# Patient Record
Sex: Female | Born: 1985 | Race: Black or African American | Hispanic: No | Marital: Married | Smoking: Never smoker
Health system: Southern US, Community
[De-identification: ages and names within clinical notes are randomized; demographics above are authoritative.]

## PROBLEM LIST (undated history)

## (undated) ENCOUNTER — Inpatient Hospital Stay (HOSPITAL_COMMUNITY): Payer: Self-pay

## (undated) DIAGNOSIS — D573 Sickle-cell trait: Secondary | ICD-10-CM

## (undated) DIAGNOSIS — O009 Unspecified ectopic pregnancy without intrauterine pregnancy: Secondary | ICD-10-CM

## (undated) DIAGNOSIS — E162 Hypoglycemia, unspecified: Secondary | ICD-10-CM

## (undated) DIAGNOSIS — D649 Anemia, unspecified: Secondary | ICD-10-CM

## (undated) DIAGNOSIS — O24419 Gestational diabetes mellitus in pregnancy, unspecified control: Secondary | ICD-10-CM

## (undated) HISTORY — PX: HERNIA REPAIR: SHX51

## (undated) HISTORY — PX: DILATION AND CURETTAGE OF UTERUS: SHX78

## (undated) HISTORY — PX: UMBILICAL HERNIA REPAIR: SHX2598

## (undated) HISTORY — DX: Sickle-cell trait: D57.3

---

## 2007-10-06 ENCOUNTER — Emergency Department (HOSPITAL_COMMUNITY): Admission: EM | Admit: 2007-10-06 | Discharge: 2007-10-06 | Payer: Self-pay | Admitting: Emergency Medicine

## 2010-03-05 ENCOUNTER — Emergency Department (HOSPITAL_COMMUNITY): Admission: EM | Admit: 2010-03-05 | Discharge: 2010-03-05 | Payer: Self-pay | Admitting: Emergency Medicine

## 2010-03-23 ENCOUNTER — Emergency Department (HOSPITAL_COMMUNITY): Admission: EM | Admit: 2010-03-23 | Discharge: 2010-03-23 | Payer: Self-pay | Admitting: Family Medicine

## 2010-10-28 ENCOUNTER — Inpatient Hospital Stay (INDEPENDENT_AMBULATORY_CARE_PROVIDER_SITE_OTHER)
Admission: RE | Admit: 2010-10-28 | Discharge: 2010-10-28 | Disposition: A | Discharge status: Home / Self Care | Payer: Medicaid Other | Source: Ambulatory Visit | Attending: Family Medicine | Admitting: Family Medicine

## 2010-10-28 DIAGNOSIS — R42 Dizziness and giddiness: Secondary | ICD-10-CM

## 2010-10-28 LAB — POCT I-STAT, CHEM 8
BUN: 8 mg/dL (ref 6–23)
Calcium, Ion: 1.23 mmol/L (ref 1.12–1.32)
Chloride: 102 mEq/L (ref 96–112)
HCT: 38 % (ref 36.0–46.0)
Potassium: 4.2 mEq/L (ref 3.5–5.1)

## 2010-10-28 LAB — POCT URINALYSIS DIPSTICK
Bilirubin Urine: NEGATIVE
Ketones, ur: NEGATIVE mg/dL
Protein, ur: NEGATIVE mg/dL
Specific Gravity, Urine: 1.02 (ref 1.005–1.030)

## 2010-10-28 LAB — POCT PREGNANCY, URINE: Preg Test, Ur: NEGATIVE

## 2010-11-13 LAB — URINALYSIS, ROUTINE W REFLEX MICROSCOPIC
Bilirubin Urine: NEGATIVE
Glucose, UA: NEGATIVE mg/dL
Hgb urine dipstick: NEGATIVE
Ketones, ur: NEGATIVE mg/dL
Protein, ur: NEGATIVE mg/dL
Urobilinogen, UA: 1 mg/dL (ref 0.0–1.0)

## 2010-11-13 LAB — WET PREP, GENITAL
Trich, Wet Prep: NONE SEEN
Yeast Wet Prep HPF POC: NONE SEEN

## 2010-11-13 LAB — GC/CHLAMYDIA PROBE AMP, GENITAL
Chlamydia, DNA Probe: NEGATIVE
GC Probe Amp, Genital: NEGATIVE

## 2010-11-13 LAB — URINE MICROSCOPIC-ADD ON

## 2010-12-06 ENCOUNTER — Emergency Department (HOSPITAL_COMMUNITY)
Admission: EM | Admit: 2010-12-06 | Discharge: 2010-12-07 | Disposition: A | Discharge status: Home / Self Care | Payer: Medicaid Other | Attending: Emergency Medicine | Admitting: Emergency Medicine

## 2010-12-06 DIAGNOSIS — M542 Cervicalgia: Secondary | ICD-10-CM | POA: Insufficient documentation

## 2010-12-06 DIAGNOSIS — S139XXA Sprain of joints and ligaments of unspecified parts of neck, initial encounter: Secondary | ICD-10-CM | POA: Insufficient documentation

## 2010-12-07 ENCOUNTER — Emergency Department (HOSPITAL_COMMUNITY): Payer: Medicaid Other

## 2011-01-19 ENCOUNTER — Inpatient Hospital Stay (INDEPENDENT_AMBULATORY_CARE_PROVIDER_SITE_OTHER)
Admission: RE | Admit: 2011-01-19 | Discharge: 2011-01-19 | Disposition: A | Discharge status: Home / Self Care | Payer: No Typology Code available for payment source | Source: Ambulatory Visit | Attending: Emergency Medicine | Admitting: Emergency Medicine

## 2011-01-19 DIAGNOSIS — J069 Acute upper respiratory infection, unspecified: Secondary | ICD-10-CM

## 2011-01-19 DIAGNOSIS — R197 Diarrhea, unspecified: Secondary | ICD-10-CM

## 2011-07-10 ENCOUNTER — Encounter: Payer: Self-pay | Admitting: Emergency Medicine

## 2011-07-10 ENCOUNTER — Emergency Department (HOSPITAL_COMMUNITY)
Admission: EM | Admit: 2011-07-10 | Discharge: 2011-07-10 | Disposition: A | Discharge status: Home / Self Care | Payer: Medicaid Other | Source: Home / Self Care | Attending: Family Medicine | Admitting: Family Medicine

## 2011-07-10 DIAGNOSIS — N760 Acute vaginitis: Secondary | ICD-10-CM

## 2011-07-10 LAB — POCT URINALYSIS DIP (DEVICE)
Bilirubin Urine: NEGATIVE
Glucose, UA: NEGATIVE mg/dL
Ketones, ur: NEGATIVE mg/dL
Nitrite: NEGATIVE
Specific Gravity, Urine: 1.02 (ref 1.005–1.030)
pH: 7 (ref 5.0–8.0)

## 2011-07-10 MED ORDER — METRONIDAZOLE 500 MG PO TABS: 500.0000 mg | ORAL_TABLET | Freq: Two times a day (BID) | ORAL | Status: AC

## 2011-07-10 NOTE — ED Notes (Signed)
Pt here with vag irritation and vag spotting that started on Friday.sx min odor but denies itching.pt due for menstrual nxt week.denies abd pain,fever,vomitting.lmp 06/17/11

## 2011-07-10 NOTE — ED Provider Notes (Signed)
History     CSN: [id number] Arrival date & time: 07/10/2011  2:54 PM   First MD Initiated Contact with Patient 07/10/11 1453      Chief Complaint  Patient presents with  . Vaginal Itching    (Consider location/radiation/quality/duration/timing/severity/associated sxs/prior treatment) Patient is a 25 y.o. female presenting with vaginal bleeding. The history is provided by the patient.  Vaginal Bleeding This is a new problem. The current episode started yesterday. The problem occurs constantly. The symptoms are relieved by nothing. She has tried nothing for the symptoms.  The patient reports she has intercourse Friday. Used condoms for protection. Noted some vaginal spotting sat. Period due next week. It had been since June since last intercourse. Denies pelvic pain. Has noted some odor. No uti symtpoms. No n/v//c/d.  History reviewed. No pertinent past medical history.  Past Surgical History  Procedure Date  . Hernia repair   . Cesarean section     No family history on file.  History  Substance Use Topics  . Smoking status: Never Smoker   . Smokeless tobacco: Not on file  . Alcohol Use: No    OB History    Grav Para Term Preterm Abortions TAB SAB Ect Mult Living                  Review of Systems  Constitutional: Negative.   Cardiovascular: Negative.   Gastrointestinal: Negative.   Genitourinary: Positive for vaginal bleeding.  Musculoskeletal: Negative.     Allergies  Review of patient's allergies indicates no known allergies.  Home Medications   Current Outpatient Rx  Name Route Sig Dispense Refill  . METRONIDAZOLE 500 MG PO TABS Oral Take 1 tablet (500 mg total) by mouth 2 (two) times daily. 14 tablet 0    BP 107/76  Pulse 83  Temp(Src) 99 F (37.2 C) (Oral)  Resp 16  SpO2 100%  LMP 06/17/2011  Physical Exam  Constitutional: She appears well-developed and well-nourished. No distress.  Cardiovascular: Normal rate and regular rhythm.     Pulmonary/Chest: Breath sounds normal.  Abdominal: Soft. Bowel sounds are normal. There is no tenderness.  Genitourinary:       Pelvic exam with female nursing personal Blase Mess assisting reveals no skin or vulvar lesions. No discharge. Old blood noted in vault . No cmt. Sample collected and sent. Odor noted.     ED Course  Procedures (including critical care time)  Labs Reviewed  POCT URINALYSIS DIP (DEVICE) - Abnormal; Notable for the following:    Hgb urine dipstick LARGE (*)    All other components within normal limits  POCT PREGNANCY, URINE  POCT PREGNANCY, URINE  POCT URINALYSIS DIPSTICK   No results found.   1. Vaginitis       MDM          Randa Spike, MD 07/10/11 [phone]

## 2011-07-11 LAB — GC/CHLAMYDIA PROBE AMP, GENITAL
Chlamydia, DNA Probe: NEGATIVE
GC Probe Amp, Genital: NEGATIVE

## 2011-07-28 ENCOUNTER — Emergency Department (HOSPITAL_COMMUNITY)
Admission: EM | Admit: 2011-07-28 | Discharge: 2011-07-28 | Disposition: A | Discharge status: Home / Self Care | Payer: Self-pay | Source: Home / Self Care | Attending: Emergency Medicine | Admitting: Emergency Medicine

## 2011-07-28 NOTE — ED Provider Notes (Signed)
History     CSN: [id number] Arrival date & time: No admission date for patient encounter.   First MD Initiated Contact with Patient 07/28/11 1709      No chief complaint on file.   (Consider location/radiation/quality/duration/timing/severity/associated sxs/prior treatment) HPI  No past medical history on file.  Past Surgical History  Procedure Date  . Hernia repair   . Cesarean section     No family history on file.  History  Substance Use Topics  . Smoking status: Never Smoker   . Smokeless tobacco: Not on file  . Alcohol Use: No    OB History    Grav Para Term Preterm Abortions TAB SAB Ect Mult Living                  Review of Systems  Allergies  Review of patient's allergies indicates no known allergies.  Home Medications  No current outpatient prescriptions on file.  LMP 06/17/2011  Physical Exam  ED Course  Procedures (including critical care time)  Labs Reviewed - No data to display No results found.   No diagnosis found.    MDM     I did not participate in the care of this patient. AMortensonMD    Luiz Blare, MD 07/28/11 2231

## 2011-07-30 ENCOUNTER — Encounter (HOSPITAL_COMMUNITY): Payer: Self-pay | Admitting: *Deleted

## 2011-07-30 ENCOUNTER — Emergency Department (HOSPITAL_COMMUNITY)
Admission: EM | Admit: 2011-07-30 | Discharge: 2011-07-30 | Disposition: A | Discharge status: Home / Self Care | Payer: Medicaid Other | Source: Home / Self Care

## 2011-07-30 DIAGNOSIS — J069 Acute upper respiratory infection, unspecified: Secondary | ICD-10-CM

## 2011-07-30 MED ORDER — BENZONATATE 100 MG PO CAPS: ORAL_CAPSULE | ORAL | Status: AC

## 2011-07-30 MED ORDER — IBUPROFEN 800 MG PO TABS: 800.0000 mg | ORAL_TABLET | Freq: Three times a day (TID) | ORAL | Status: AC

## 2011-07-30 NOTE — ED Notes (Signed)
C/O sore throat x 1 wk; started few days later w/ productive cough and nasal congestion w/ HAs.  Has taken Advil and decongestant.  Denies fevers.

## 2011-07-30 NOTE — ED Provider Notes (Signed)
History     CSN: [id number] Arrival date & time: 07/30/2011 12:03 PM   None     Chief Complaint  Patient presents with  . Sore Throat  . Cough  . Nasal Congestion    (Consider location/radiation/quality/duration/timing/severity/associated sxs/prior treatment) HPI Comments: Onset of sore throat approx one week ago. Worse at night. No change with swallowing. Then 2 days ago began with nasal congestion and cough. No fever or myalgias. Is taking Advil and Nyquil with minimal relief.    Patient is a 25 y.o. female presenting with pharyngitis and cough. The history is provided by the patient.  Sore Throat This is a new problem. Episode onset: one week ago. The problem occurs constantly. The problem has not changed since onset.Pertinent negatives include no shortness of breath. The symptoms are aggravated by nothing. Treatments tried: advil. The treatment provided mild relief.  Cough This is a new problem. The current episode started more than 2 days ago. The problem occurs hourly. The problem has not changed since onset.The cough is productive of sputum. There has been no fever. Associated symptoms include chills, rhinorrhea and sore throat. Pertinent negatives include no ear pain, no myalgias, no shortness of breath and no wheezing. She has tried cough syrup for the symptoms. The treatment provided no relief. She is not a smoker. Her past medical history does not include asthma.    History reviewed. No pertinent past medical history.  Past Surgical History  Procedure Date  . Hernia repair   . Cesarean section     History reviewed. No pertinent family history.  History  Substance Use Topics  . Smoking status: Former Games developer  . Smokeless tobacco: Not on file  . Alcohol Use: No    OB History    Grav Para Term Preterm Abortions TAB SAB Ect Mult Living                  Review of Systems  Constitutional: Positive for chills. Negative for fever.  HENT: Positive for congestion,  sore throat, rhinorrhea and postnasal drip. Negative for ear pain and sinus pressure.   Respiratory: Positive for cough. Negative for shortness of breath and wheezing.   Gastrointestinal: Negative for nausea and vomiting.  Musculoskeletal: Negative for myalgias.    Allergies  Review of patient's allergies indicates no known allergies.  Home Medications  No current outpatient prescriptions on file.  BP 115/73  Pulse 87  Temp(Src) 99.5 F (37.5 C) (Oral)  Resp 16  SpO2 100%  LMP 07/10/2011  Physical Exam  Nursing note and vitals reviewed. Constitutional: She appears well-developed and well-nourished. No distress.  HENT:  Head: Normocephalic and atraumatic.  Right Ear: Tympanic membrane, external ear and ear canal normal.  Left Ear: Tympanic membrane, external ear and ear canal normal.  Nose: Nose normal.  Mouth/Throat: Uvula is midline, oropharynx is clear and moist and mucous membranes are normal. No oropharyngeal exudate, posterior oropharyngeal edema or posterior oropharyngeal erythema.  Neck: Neck supple.  Cardiovascular: Normal rate, regular rhythm and normal heart sounds.   Pulmonary/Chest: Effort normal and breath sounds normal. No respiratory distress.  Lymphadenopathy:    She has no cervical adenopathy.  Neurological: She is alert.  Skin: Skin is warm and dry.  Psychiatric: She has a normal mood and affect.    ED Course  Procedures (including critical care time)  Labs Reviewed - No data to display No results found.   No diagnosis found.    MDM  Melody Comas, [place] 07/30/11 1318

## 2011-08-04 NOTE — ED Provider Notes (Signed)
Dx Amanda [name]  Sent home with tesslon motrin Medical screening examination/treatment/procedure(s) were performed by non-physician practitioner and as supervising physician I was immediately available for consultation/collaboration.  Luiz Blare MD   Luiz Blare, MD 08/04/11 [phone]

## 2012-10-21 ENCOUNTER — Emergency Department (INDEPENDENT_AMBULATORY_CARE_PROVIDER_SITE_OTHER)
Admission: EM | Admit: 2012-10-21 | Discharge: 2012-10-21 | Disposition: A | Discharge status: Home / Self Care | Payer: Self-pay | Source: Home / Self Care | Attending: Emergency Medicine | Admitting: Emergency Medicine

## 2012-10-21 ENCOUNTER — Emergency Department (INDEPENDENT_AMBULATORY_CARE_PROVIDER_SITE_OTHER): Payer: Self-pay

## 2012-10-21 ENCOUNTER — Other Ambulatory Visit: Payer: Self-pay

## 2012-10-21 ENCOUNTER — Encounter (HOSPITAL_COMMUNITY): Payer: Self-pay | Admitting: Emergency Medicine

## 2012-10-21 DIAGNOSIS — R0789 Other chest pain: Secondary | ICD-10-CM

## 2012-10-21 DIAGNOSIS — K219 Gastro-esophageal reflux disease without esophagitis: Secondary | ICD-10-CM

## 2012-10-21 MED ORDER — MELOXICAM 15 MG PO TABS: 15.0000 mg | ORAL_TABLET | Freq: Every day | ORAL | Status: DC

## 2012-10-21 MED ORDER — CYCLOBENZAPRINE HCL 5 MG PO TABS: 5.0000 mg | ORAL_TABLET | Freq: Three times a day (TID) | ORAL | Status: DC | PRN

## 2012-10-21 MED ORDER — OMEPRAZOLE 20 MG PO CPDR: 20.0000 mg | DELAYED_RELEASE_CAPSULE | Freq: Two times a day (BID) | ORAL | Status: DC

## 2012-10-21 NOTE — ED Provider Notes (Signed)
Chief Complaint  Patient presents with  . Chest Pain    History of Present Illness:   Amanda [name] is a 27 year old female who presents with a one-year history of recurring chest pain. The pain is located in the left pectoral area with occasional radiation down the left arm and some numbness and tingling in the arm. The pain comes and goes, is described as a shooting pain and is rated 2-3/10 in intensity. Over the past 3 weeks she's had daily pain that can last for minutes and on one occasion the pain in the arm lasted for an hour. Nothing makes it worse including exercise, exertion, position, activity, or meals. The pain is not pleuritic and not worse with coughing, sneezing, or laughing. Nothing makes the pain better. It's not associated with shortness of breath, nausea, or diaphoresis. She denies any fever, chills, or weight loss she also has intermittent blurry vision and headache. She's had no coughing, wheezing, or shortness of breath. She's had some indigestion since December. She denies any palpitations, dizziness, or syncope. She's had no abdominal pain, nausea, or vomiting. She denies any cardiac history, or history of diabetes, hypertension, hypercholesterolemia, cigarette smoking, or family history of heart disease. She denies any leg pain or swelling.  Review of Systems:  Other than noted above, the patient denies any of the following symptoms. Systemic:  No fever, chills, sweats, or fatigue. ENT:  No nasal congestion, rhinorrhea, or sore throat. Pulmonary:  No cough, wheezing, shortness of breath, sputum production, hemoptysis. Cardiac:  No palpitations, rapid heartbeat, dizziness, presyncope or syncope. GI:  No abdominal pain, heartburn, nausea, or vomiting. Ext:  No leg pain or swelling.  PMFSH:  Past medical history, family history, social history, meds, and allergies were reviewed and updated as needed.   Physical Exam:   Vital signs:  BP 121/86  Pulse 91  Temp(Src) 98.8 F  (37.1 C) (Oral)  Resp 19  SpO2 99%  LMP 09/21/2012 Gen:  Alert, oriented, in no distress, skin warm and dry. Eye:  PERRL, lids and conjunctivas normal.  Sclera non-icteric. ENT:  Mucous membranes moist, pharynx clear. Neck:  Supple, no adenopathy or tenderness.  No JVD. Lungs:  Clear to auscultation, no wheezes, rales or rhonchi.  No respiratory distress. Heart:  Regular rhythm.  No gallops, murmers, clicks or rubs. Chest:  No chest wall tenderness. Abdomen:  Soft, nontender, no organomegaly or mass.  Bowel sounds normal.  No pulsatile abdominal mass or bruit. Ext:  No edema.  No calf tenderness and Homann's sign negative.  Pulses full and equal. Skin:  Warm and dry.  No rash.   Radiology:  Dg Chest 2 View  10/21/2012  *RADIOLOGY REPORT*  Clinical Data: Chest pain  CHEST - 2 VIEW  Comparison: None.  Findings: There is no pulmonary edema, focal infiltrate, or pleural effusion.  The mediastinal contour and cardiac silhouette are normal.  The soft tissues and osseous structures are normal.  IMPRESSION: No acute cardiopulmonary disease identified.   Original Report Authenticated By: Sherian Rein, M.D.    I reviewed the images independently and personally and concur with the radiologist's findings.  EKG:   Date: 10/21/2012  Rate: 77  Rhythm: normal sinus rhythm  QRS Axis: right R vector was 109  Intervals: normal  ST/T Wave abnormalities: normal  Conduction Disutrbances:none  Narrative Interpretation: Normal sinus rhythm, rightward axis, otherwise normal EKG.  Old EKG Reviewed: none available  Assessment:  The primary encounter diagnosis was Musculoskeletal chest pain. A diagnosis of  GERD (gastroesophageal reflux disease) was also pertinent to this visit.   Plan:   1.  The following meds were prescribed:   Discharge Medication List as of 10/21/2012  9:01 PM    START taking these medications   Details  cyclobenzaprine (FLEXERIL) 5 MG tablet Take 1 tablet (5 mg total) by mouth 3  (three) times daily as needed for muscle spasms., Starting 10/21/2012, Until Discontinued, Normal    meloxicam (MOBIC) 15 MG tablet Take 1 tablet (15 mg total) by mouth daily., Starting 10/21/2012, Until Discontinued, Normal    omeprazole (PRILOSEC) 20 MG capsule Take 1 capsule (20 mg total) by mouth 2 (two) times daily before a meal., Starting 10/21/2012, Until Discontinued, Normal       2.  The patient was instructed in symptomatic care and handouts were given. She was given dietary instructions for reflux symptoms. 3.  The patient was told to return if becoming worse in any way, if no better in 3 or 4 days, and given some red flag symptoms such as shortness of breath, fever, dizziness, syncope, diaphoresis, worsening pain, or nausea and vomiting that would indicate earlier return.  Follow up:  The patient was told to follow up with primary care physician in one to 2 weeks.    Reuben Likes, MD 10/21/12 2114

## 2012-10-21 NOTE — ED Notes (Signed)
[facility] has been called. 

## 2012-10-21 NOTE — ED Notes (Signed)
Pt is here for chest pains x1 year Came today b/c her sx have been more frequent w/in the past month Sx include: intermittent numbness of left arm that'll last 1.5 hours and intermittent chest pain on left side that'll last 5 minutes Also c/o blurry vision, headache Denies: SOB, edema, inj/trauma, strenuous activity Reports being sick w/a cold in the beg of Jan; lots of coughing Last took Motrin on Sat for the discomfort  She is alert and responsive w/no signs of acute distress.

## 2013-06-05 ENCOUNTER — Encounter (HOSPITAL_COMMUNITY): Payer: Self-pay | Admitting: Emergency Medicine

## 2013-06-05 ENCOUNTER — Emergency Department (HOSPITAL_COMMUNITY)
Admission: EM | Admit: 2013-06-05 | Discharge: 2013-06-05 | Disposition: A | Discharge status: Home / Self Care | Payer: BC Managed Care – PPO | Source: Home / Self Care | Attending: Emergency Medicine | Admitting: Emergency Medicine

## 2013-06-05 DIAGNOSIS — G43909 Migraine, unspecified, not intractable, without status migrainosus: Secondary | ICD-10-CM

## 2013-06-05 HISTORY — DX: Hypoglycemia, unspecified: E16.2

## 2013-06-05 LAB — POCT I-STAT, CHEM 8
Calcium, Ion: 1.29 mmol/L — ABNORMAL HIGH (ref 1.12–1.23)
Chloride: 102 mEq/L (ref 96–112)
HCT: 39 % (ref 36.0–46.0)
Sodium: 139 mEq/L (ref 135–145)

## 2013-06-05 MED ORDER — SUMATRIPTAN SUCCINATE 50 MG PO TABS: 50.0000 mg | ORAL_TABLET | ORAL | Status: DC | PRN

## 2013-06-05 NOTE — ED Notes (Signed)
Reports an episode this afternoon of feeling off balance, dizzy, difficulty focusing, and blurry vision.

## 2013-06-05 NOTE — ED Provider Notes (Signed)
Chief Complaint:   Chief Complaint  Patient presents with  . 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 This happened around 2:45 PM. She denies any specific precipitating factor. She noted first blurring of her vision. She noted a cloudy spot in the central visual field of her left eye. She was able to notice some clouding in the right eye as well. This lasted for about 30 minutes. Thereafter she felt dizzy and lightheaded. She denies any whirling vertigo. She describes an "out of body sensation." She then developed a throbbing frontal headache which was 7-8/10 in intensity now down to 3/10. She felt disoriented and off balance. The patient states she blacked out for about a second and fell backwards and hit the wall. She did not faint completely. She states she's had spells like this before and they've been due to hypoglycemia. She states she was here for one such episode in which her sugar was very low, however reviewing her previous visits I do not see any episodes which document a low blood sugar. She did have a similar episode several years ago but at that time her glucose was 83. She has a history of migraine type headaches in the past which are similar to these. She's never seen a physician for them and never had any testing or treatment. She's had episodes similar to this in the past when her sugar drops too low. She was unable to get her sugar tested today. She denies any paresthesias, diplopia, difficulty with speech, swallowing, chest pain, shortness of breath, palpitations, difficulty with ambulation or coordination. She denies any nausea, vomiting, photophobia, or phonophobia.  Review of Systems:  Other than noted above, the patient denies any of the following symptoms: Systemic:  No fever, chills, fatigue, photophobia, stiff neck. Eye:  No redness, eye pain, discharge,  blurred vision, or diplopia. ENT:  No nasal congestion, rhinorrhea, sinus pressure or pain, sneezing, earache, or sore throat.  No jaw claudication. Neuro:  No paresthesias, loss of consciousness, seizure activity, muscle weakness, trouble with coordination or gait, trouble speaking or swallowing. Psych:  No depression, anxiety or trouble sleeping.  PMFSH:  Past medical history, family history, social history, meds, and allergies were reviewed.  Last menstrual period was September 20. She denies sexual activity.  Physical Exam:   Vital signs:  BP 115/84  Pulse 97  Temp(Src) 99.6 F (37.6 C) (Oral)  Resp 16  SpO2 100% Filed Vitals:   06/05/13 1616 06/05/13 1758 Supine  06/05/13 1800 Sitting  06/05/13 1802 Standing   BP: 109/78 118/73 112/79 115/84  Pulse: 89 87 85 97  Temp: 99.6 F (37.6 C)     TempSrc: Oral     Resp: 16     SpO2: 100%      General:  Alert and oriented.  In no distress. Eye:  Lids and conjunctivas normal.  PERRL,  Full EOMs.  Fundi benign with normal discs and vessels. ENT:  No cranial or facial tenderness to palpation.  TMs and canals clear.  Nasal mucosa was normal and uncongested without any drainage. No intra oral lesions, pharynx clear, mucous membranes moist, dentition normal. Neck:  Supple, full ROM, no tenderness to palpation.  No adenopathy or mass. Neuro:  Alert and orented times 3.  Speech was clear, fluent, and appropriate.  Cranial nerves intact. No pronator drift, muscle strength normal. Finger to nose  normal.  DTRs were 2+ and symmetrical.Station and gait were normal.  Romberg's sign was normal.  Able to perform tandem gait well. Psych:  Normal affect.  Results for orders placed during the hospital encounter of 06/05/13  POCT I-STAT, CHEM 8      Result Value Range   Sodium 139  135 - 145 mEq/L   Potassium 4.3  3.5 - 5.1 mEq/L   Chloride 102  96 - 112 mEq/L   BUN 14  6 - 23 mg/dL   Creatinine, Ser 1.61  0.50 - 1.10 mg/dL   Glucose, Bld 80  70 -  99 mg/dL   Calcium, Ion 0.96 (*) 1.12 - 1.23 mmol/L   TCO2 26  0 - 100 mmol/L   Hemoglobin 13.3  12.0 - 15.0 g/dL   HCT 04.5  40.9 - 81.1 %    Assessment:  The encounter diagnosis was Migraine headache.  There is nothing to suggest hypoglycemia. This is completely compatible with a migraine phenomenon.  Plan:   1.  Meds:  The following meds were prescribed:   Discharge Medication List as of 06/05/2013  6:11 PM    START taking these medications   Details  SUMAtriptan (IMITREX) 50 MG tablet Take 1 tablet (50 mg total) by mouth every 2 (two) hours as needed for migraine. May repeat in 2 hours if headache persists or recurs., Starting 06/05/2013, Until Discontinued, Normal        2.  Patient Education/Counseling:  The patient was given appropriate handouts, self care instructions, and instructed in symptomatic relief.  Suggested she try the sumatriptan along with 2-4 ibuprofen tablets if she should develop a headache in the future. It may be going without food or drops in her blood sugar might be triggering factors for these, but I do not think that her symptoms are entirely to just a low blood sugar.  3.  Follow up:  The patient was told to follow up if no better in 3 to 4 days, if becoming worse in any way, and given some red flag symptoms such as any new neurological or cardiovascular symptoms which would prompt immediate return.  Follow up here as necessary.     Reuben Likes, MD 06/05/13 2159

## 2013-08-26 ENCOUNTER — Emergency Department (HOSPITAL_COMMUNITY)
Admission: EM | Admit: 2013-08-26 | Discharge: 2013-08-26 | Disposition: A | Discharge status: Home / Self Care | Payer: BC Managed Care – PPO | Source: Home / Self Care

## 2013-08-26 ENCOUNTER — Encounter (HOSPITAL_COMMUNITY): Payer: Self-pay | Admitting: Emergency Medicine

## 2013-08-26 ENCOUNTER — Other Ambulatory Visit (HOSPITAL_COMMUNITY)
Admission: RE | Admit: 2013-08-26 | Discharge: 2013-08-26 | Disposition: A | Discharge status: Home / Self Care | Payer: BC Managed Care – PPO | Source: Ambulatory Visit | Attending: Emergency Medicine | Admitting: Emergency Medicine

## 2013-08-26 DIAGNOSIS — N760 Acute vaginitis: Secondary | ICD-10-CM | POA: Insufficient documentation

## 2013-08-26 DIAGNOSIS — Z113 Encounter for screening for infections with a predominantly sexual mode of transmission: Secondary | ICD-10-CM | POA: Insufficient documentation

## 2013-08-26 LAB — POCT PREGNANCY, URINE: Preg Test, Ur: NEGATIVE

## 2013-08-26 LAB — POCT URINALYSIS DIP (DEVICE)
Glucose, UA: NEGATIVE mg/dL
Nitrite: NEGATIVE
Protein, ur: 30 mg/dL — AB
Urobilinogen, UA: 0.2 mg/dL (ref 0.0–1.0)
pH: 5.5 (ref 5.0–8.0)

## 2013-08-26 MED ORDER — FLUCONAZOLE 150 MG PO TABS: 150.0000 mg | ORAL_TABLET | Freq: Once | ORAL | Status: DC

## 2013-08-26 MED ORDER — METRONIDAZOLE 500 MG PO TABS: 500.0000 mg | ORAL_TABLET | Freq: Two times a day (BID) | ORAL | Status: DC

## 2013-08-26 NOTE — ED Notes (Signed)
C/o vaginal itching

## 2013-08-26 NOTE — ED Notes (Signed)
Call back for labs verified

## 2013-08-26 NOTE — ED Provider Notes (Signed)
Chief Complaint:   Chief Complaint  Patient presents with  . Vaginal Itching    History of Present Illness:   Amanda [name] is a 27 year old female who has had a four-day history of vaginal irritation, itching, and clear discharge. She denies any odor. She's had no pelvic pain, dysuria, fever, chills, nausea, or vomiting. She has felt somewhat nauseated and her last menstrual period was December 7. It began again today. She tends to have heavy periods. She is sexually active and not using any birth control form.  Review of Systems:  Other than noted above, the patient denies any of the following symptoms: Systemic:  No fever, chills, sweats, or weight loss. GI:  No abdominal pain, nausea, anorexia, vomiting, diarrhea, constipation, melena or hematochezia. GU:  No dysuria, frequency, urgency, hematuria, vaginal discharge, itching, or abnormal vaginal bleeding. Skin:  No rash or itching.  PMFSH:  Past medical history, family history, social history, meds, and allergies were reviewed.   Physical Exam:   Vital signs:  BP 136/84  Pulse 88  Temp(Src) 98.4 F (36.9 C) (Oral)  Resp 16  SpO2 100% General:  Alert, oriented and in no distress. Lungs:  Breath sounds clear and equal bilaterally.  No wheezes, rales or rhonchi. Heart:  Regular rhythm.  No gallops or murmers. Abdomen:  Soft, flat and non-distended.  No organomegaly or mass.  No tenderness, guarding or rebound.  Bowel sounds normally active. Pelvic exam:  Normal external genitalia, there was a large amount of yellowish, nonodorous vaginal discharge. Cervix appeared normal. There was no pain on cervical motion. Uterus was normal in size and nontender. No adnexal tenderness or mass. DNA probes for gonorrhea, Chlamydia, Trichomonas, Gardnerella, Candida were obtained. Skin:  Clear, warm and dry.  Labs:   Results for orders placed during the hospital encounter of 08/26/13  POCT URINALYSIS DIP (DEVICE)      Result Value Range   Glucose,  UA NEGATIVE  NEGATIVE mg/dL   Bilirubin Urine NEGATIVE  NEGATIVE   Ketones, ur NEGATIVE  NEGATIVE mg/dL   Specific Gravity, Urine >=1.030  1.005 - 1.030   Hgb urine dipstick SMALL (*) NEGATIVE   pH 5.5  5.0 - 8.0   Protein, ur 30 (*) NEGATIVE mg/dL   Urobilinogen, UA 0.2  0.0 - 1.0 mg/dL   Nitrite NEGATIVE  NEGATIVE   Leukocytes, UA TRACE (*) NEGATIVE  POCT PREGNANCY, URINE      Result Value Range   Preg Test, Ur NEGATIVE  NEGATIVE    Assessment:  The encounter diagnosis was Vaginitis.  Plan:   1.  Meds:  The following meds were prescribed:   Discharge Medication List as of 08/26/2013  9:07 AM    START taking these medications   Details  fluconazole (DIFLUCAN) 150 MG tablet Take 1 tablet (150 mg total) by mouth once., Starting 08/26/2013, Normal    metroNIDAZOLE (FLAGYL) 500 MG tablet Take 1 tablet (500 mg total) by mouth 2 (two) times daily., Starting 08/26/2013, Until Discontinued, Normal        2.  Patient Education/Counseling:  The patient was given appropriate handouts, self care instructions, and instructed in symptomatic relief.   3.  Follow up:  The patient was told to follow up if no better in 3 to 4 days, if becoming worse in any way, and given some red flag symptoms such as pelvic pain or fever which would prompt immediate return.  Follow up here as necessary.     Reuben Likes, MD 08/26/13  1450 

## 2013-08-29 ENCOUNTER — Emergency Department (HOSPITAL_COMMUNITY)
Admission: EM | Admit: 2013-08-29 | Discharge: 2013-08-29 | Disposition: A | Discharge status: Home / Self Care | Payer: BC Managed Care – PPO | Source: Home / Self Care | Attending: Family Medicine | Admitting: Family Medicine

## 2013-08-29 ENCOUNTER — Encounter (HOSPITAL_COMMUNITY): Payer: Self-pay | Admitting: Emergency Medicine

## 2013-08-29 DIAGNOSIS — A6000 Herpesviral infection of urogenital system, unspecified: Secondary | ICD-10-CM

## 2013-08-29 MED ORDER — VALACYCLOVIR HCL 1 G PO TABS: 1000.0000 mg | ORAL_TABLET | Freq: Two times a day (BID) | ORAL | Status: DC

## 2013-08-29 NOTE — ED Provider Notes (Signed)
CSN: [id number]     Arrival date & time 08/29/13  0806 History   First MD Initiated Contact with Patient 08/29/13 [phone]     Chief Complaint  Patient presents with  . Vaginal Itching   (Consider location/radiation/quality/duration/timing/severity/associated sxs/prior Treatment) Patient is a 28 y.o. female presenting with vaginal itching. The history is provided by the patient.  Vaginal Itching This is a new problem. The current episode started 2 days ago. The problem has been gradually worsening (seen 12/30 with vag d/c--bv, now with pain blistering vag rash.). Pertinent negatives include no abdominal pain.    Past Medical History  Diagnosis Date  . Hypoglycemia    Past Surgical History  Procedure Laterality Date  . Hernia repair    . Cesarean section    . Umbilical hernia repair     History reviewed. No pertinent family history. History  Substance Use Topics  . Smoking status: Former Research scientist (life sciences)  . Smokeless tobacco: Not on file  . Alcohol Use: No   OB History   Grav Para Term Preterm Abortions TAB SAB Ect Mult Living                 Review of Systems  Constitutional: Negative.   Gastrointestinal: Negative for abdominal pain.  Genitourinary: Positive for vaginal pain and pelvic pain. Negative for vaginal bleeding and vaginal discharge.  Musculoskeletal: Negative.     Allergies  Review of patient's allergies indicates no known allergies.  Home Medications   Current Outpatient Rx  Name  Route  Sig  Dispense  Refill  . cyclobenzaprine (FLEXERIL) 5 MG tablet   Oral   Take 1 tablet (5 mg total) by mouth 3 (three) times daily as needed for muscle spasms.   30 tablet   3   . fluconazole (DIFLUCAN) 150 MG tablet   Oral   Take 1 tablet (150 mg total) by mouth once.   1 tablet   3   . meloxicam (MOBIC) 15 MG tablet   Oral   Take 1 tablet (15 mg total) by mouth daily.   30 tablet   0   . metroNIDAZOLE (FLAGYL) 500 MG tablet   Oral   Take 1 tablet (500 mg total) by  mouth 2 (two) times daily.   14 tablet   3   . omeprazole (PRILOSEC) 20 MG capsule   Oral   Take 1 capsule (20 mg total) by mouth 2 (two) times daily before a meal.   60 capsule   0   . SUMAtriptan (IMITREX) 50 MG tablet   Oral   Take 1 tablet (50 mg total) by mouth every 2 (two) hours as needed for migraine. May repeat in 2 hours if headache persists or recurs.   10 tablet   0   . valACYclovir (VALTREX) 1000 MG tablet   Oral   Take 1 tablet (1,000 mg total) by mouth 2 (two) times daily.   20 tablet   1    BP 124/74  Pulse 78  Temp(Src) 98.6 F (37 C) (Oral)  Resp 16  SpO2 100%  LMP 08/29/2013 Physical Exam  Nursing note and vitals reviewed. Constitutional: She appears well-developed and well-nourished.  Abdominal: Soft. Bowel sounds are normal.  Genitourinary: Pelvic exam was performed with patient supine. There is rash, tenderness and lesion on the left labia. No vaginal discharge found.  Skin: Skin is warm and dry.    ED Course  Procedures (including critical care time) Labs Review Labs Reviewed  HERPES SIMPLEX  VIRUS CULTURE   Imaging Review No results found.  EKG Interpretation    Date/Time:    Ventricular Rate:    PR Interval:    QRS Duration:   QT Interval:    QTC Calculation:   R Axis:     Text Interpretation:              MDM      Billy Fischer, MD 08/29/13 [phone]

## 2013-08-29 NOTE — ED Notes (Signed)
Pt  Has  Symptoms  Of  Vaginal irritation  And         Bumps     -  Pt  Was   Seen  A  Few  Days  Ago  For   A  Vaginal  Infection   -  She  States  She  Did  Not  Have  The  Bumps    Then      She  States  She  Is  On her  Cycle  At  This  Time

## 2013-08-29 NOTE — ED Notes (Signed)
GC/Chlamydia neg., Affirm: Candida and Trich neg., Gardnerella pos. Pt. adequately treated with Flagyl. Roselyn Meier 08/29/2013

## 2013-09-01 LAB — HERPES SIMPLEX VIRUS CULTURE: CULTURE: DETECTED

## 2013-09-03 ENCOUNTER — Telehealth (HOSPITAL_COMMUNITY): Payer: Self-pay | Admitting: *Deleted

## 2013-09-03 NOTE — ED Notes (Addendum)
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  Pt. adequately treated with Valtrex.  I called pt. and left a message to call.  Call 1. Amanda [name] 09/03/2013 Pt. called back.   I could not talk to her then because I was transferring a pt. but I told her I would call back later.  I called back.  Pt. verified x 2 and given results.  Pt. told she was adequately treated with Valtrex.  Pt. Instructed to notify her partner. You can pass the virus even when you don't have an outbreak, so always practice safe sex. Get treated for each outbreak with Acyclovir or Valtrex. You may want to get an OB-GYN doctor who can call in a prescription for you when you have an outbreak or for suppressive treatment. Pt. voiced understanding. Amanda [name] 09/03/2013

## 2014-01-02 ENCOUNTER — Ambulatory Visit (INDEPENDENT_AMBULATORY_CARE_PROVIDER_SITE_OTHER): Payer: BC Managed Care – PPO | Admitting: Internal Medicine

## 2014-01-02 VITALS — BP 116/70 | HR 82 | Temp 98.5°F | Resp 16 | Ht 66.0 in | Wt 158.2 lb

## 2014-01-02 DIAGNOSIS — L293 Anogenital pruritus, unspecified: Secondary | ICD-10-CM

## 2014-01-02 DIAGNOSIS — A6009 Herpesviral infection of other urogenital tract: Secondary | ICD-10-CM

## 2014-01-02 DIAGNOSIS — N760 Acute vaginitis: Secondary | ICD-10-CM

## 2014-01-02 DIAGNOSIS — N898 Other specified noninflammatory disorders of vagina: Secondary | ICD-10-CM

## 2014-01-02 DIAGNOSIS — A6000 Herpesviral infection of urogenital system, unspecified: Secondary | ICD-10-CM

## 2014-01-02 LAB — POCT WET PREP WITH KOH
KOH Prep POC: POSITIVE
Trichomonas, UA: NEGATIVE
Yeast Wet Prep HPF POC: NEGATIVE

## 2014-01-02 MED ORDER — MUPIROCIN 2 % EX OINT: 1.0000 "application " | TOPICAL_OINTMENT | Freq: Three times a day (TID) | CUTANEOUS | Status: DC

## 2014-01-02 MED ORDER — VALACYCLOVIR HCL 1 G PO TABS: 1000.0000 mg | ORAL_TABLET | Freq: Two times a day (BID) | ORAL | Status: DC

## 2014-01-02 MED ORDER — METRONIDAZOLE 500 MG PO TABS: 500.0000 mg | ORAL_TABLET | Freq: Two times a day (BID) | ORAL | Status: DC

## 2014-01-02 NOTE — Patient Instructions (Signed)
Genital Herpes  Genital herpes is a sexually transmitted disease. This means that it is a disease passed by having sex with an infected person. There is no cure for genital herpes. The time between attacks can be months to years. The virus may live in a person but produce no problems (symptoms). This infection can be passed to a baby as it travels down the birth canal (vagina). In a newborn, this can cause central nervous system damage, eye damage, or even death. The virus that causes genital herpes is usually HSV-2 virus. The virus that causes oral herpes is usually HSV-1. The diagnosis (learning what is wrong) is made through culture results.  SYMPTOMS   Usually symptoms of pain and itching begin a few days to a week after contact. It first appears as small blisters that progress to small painful ulcers which then scab over and heal after several days. It affects the outer genitalia, birth canal, cervix, penis, anal area, buttocks, and thighs.  HOME CARE INSTRUCTIONS   · Keep ulcerated areas dry and clean.  · Take medications as directed. Antiviral medications can speed up healing. They will not prevent recurrences or cure this infection. These medications can also be taken for suppression if there are frequent recurrences.  · While the infection is active, it is contagious. Avoid all sexual contact during active infections.  · Condoms may help prevent spread of the herpes virus.  · Practice safe sex.  · Wash your hands thoroughly after touching the genital area.  · Avoid touching your eyes after touching your genital area.  · Inform your caregiver if you have had genital herpes and become pregnant. It is your responsibility to insure a safe outcome for your baby in this pregnancy.  · Only take over-the-counter or prescription medicines for pain, discomfort, or fever as directed by your caregiver.  SEEK MEDICAL CARE IF:   · You have a recurrence of this infection.  · You do not respond to medications and are not  improving.  · You have new sources of pain or discharge which have changed from the original infection.  · You have an oral temperature above 102° F (38.9° C).  · You develop abdominal pain.  · You develop eye pain or signs of eye infection.  Document Released: 08/11/2000 Document Revised: 11/06/2011 Document Reviewed: 09/01/2009  Exit[employer]® Patient Information ©2014 [employer].

## 2014-01-02 NOTE — Progress Notes (Signed)
   Subjective:    Patient ID: Amanda [name], female    DOB: 1985/12/11, 28 y.o.   MRN: [id number]  HPI 28 year old female pt here for vaginal itching. The itching has been going on for about three days. No discharge. No new recent partner. No change of soap. No OTC medications used for relief. Noticed a bump on the outside of her vagina yesterday. The bump is irritating. Pt thinks she has bacterial infection. Currently out of valtrex. Uses when she has a flare up.    Review of Systems     Objective:   Physical Exam  Constitutional: She is oriented to person, place, and time. She appears well-developed and well-nourished.  HENT:  Head: Normocephalic.  Eyes: EOM are normal.  Neck: Normal range of motion.  Pulmonary/Chest: Effort normal.  Genitourinary: Vagina normal.    Pelvic exam was performed with patient in the knee-chest position. There is no rash, tenderness, lesion or injury on the right labia. There is rash, tenderness and lesion on the left labia. No vaginal discharge found.  Neurological: She is alert and oriented to person, place, and time. She exhibits normal muscle tone. Coordination normal.  Skin: Rash noted.  Psychiatric: She has a normal mood and affect.   3-4 vesicles with purulence Hx proven hsv2 Bacterial culture done Results for orders placed in visit on 01/02/14  POCT WET PREP WITH KOH      Result Value Ref Range   Trichomonas, UA Negative     Clue Cells Wet Prep HPF POC 100%     Epithelial Wet Prep HPF POC 10-tntc     Yeast Wet Prep HPF POC neg     Bacteria Wet Prep HPF POC 4+     RBC Wet Prep HPF POC 0-1     WBC Wet Prep HPF POC 5-10     KOH Prep POC Positive           Assessment & Plan:  Valtrex 1g per day suppressive therapy after 10d bid Bacterial vaginosis/Flagyl 500mg  bid 1 week

## 2014-01-02 NOTE — Progress Notes (Signed)
   Subjective:    Patient ID: Amanda [name], female    DOB: 10/28/1985, 28 y.o.   MRN: [id number]  HPI    Review of Systems     Objective:   Physical Exam        Assessment & Plan:

## 2014-01-04 LAB — WOUND CULTURE: Organism ID, Bacteria: NORMAL

## 2014-01-10 ENCOUNTER — Ambulatory Visit (INDEPENDENT_AMBULATORY_CARE_PROVIDER_SITE_OTHER): Payer: BC Managed Care – PPO | Admitting: Family Medicine

## 2014-01-10 VITALS — BP 106/82 | HR 100 | Temp 99.5°F | Resp 18 | Ht 65.25 in | Wt 158.4 lb

## 2014-01-10 DIAGNOSIS — N912 Amenorrhea, unspecified: Secondary | ICD-10-CM

## 2014-01-10 DIAGNOSIS — N760 Acute vaginitis: Secondary | ICD-10-CM

## 2014-01-10 DIAGNOSIS — Z3480 Encounter for supervision of other normal pregnancy, unspecified trimester: Secondary | ICD-10-CM

## 2014-01-10 LAB — POCT WET PREP WITH KOH
KOH PREP POC: NEGATIVE
RBC Wet Prep HPF POC: NEGATIVE
TRICHOMONAS UA: NEGATIVE
YEAST WET PREP PER HPF POC: NEGATIVE

## 2014-01-10 LAB — POCT URINE PREGNANCY: Preg Test, Ur: POSITIVE

## 2014-01-10 MED ORDER — FLUCONAZOLE 150 MG PO TABS: 150.0000 mg | ORAL_TABLET | Freq: Once | ORAL | Status: DC

## 2014-01-10 MED ORDER — PRENATAL MULTIVITAMIN + DHA 28-0.8 & 200 MG PO MISC: ORAL | Status: DC

## 2014-01-10 MED ORDER — ONDANSETRON 8 MG PO TBDP: 8.0000 mg | ORAL_TABLET | Freq: Three times a day (TID) | ORAL | Status: DC | PRN

## 2014-01-10 NOTE — Patient Instructions (Signed)
I suggest making an appointment with [place], [facility], or [facility].

## 2014-01-12 ENCOUNTER — Inpatient Hospital Stay (HOSPITAL_COMMUNITY)
Admission: AD | Admit: 2014-01-12 | Discharge: 2014-01-12 | Disposition: A | Discharge status: Home / Self Care | Payer: BC Managed Care – PPO | Source: Ambulatory Visit | Attending: Obstetrics & Gynecology | Admitting: Obstetrics & Gynecology

## 2014-01-12 ENCOUNTER — Encounter (HOSPITAL_COMMUNITY): Payer: Self-pay

## 2014-01-12 ENCOUNTER — Inpatient Hospital Stay (HOSPITAL_COMMUNITY): Payer: BC Managed Care – PPO

## 2014-01-12 DIAGNOSIS — O00109 Unspecified tubal pregnancy without intrauterine pregnancy: Secondary | ICD-10-CM | POA: Insufficient documentation

## 2014-01-12 DIAGNOSIS — Z87891 Personal history of nicotine dependence: Secondary | ICD-10-CM | POA: Insufficient documentation

## 2014-01-12 DIAGNOSIS — O009 Unspecified ectopic pregnancy without intrauterine pregnancy: Secondary | ICD-10-CM

## 2014-01-12 DIAGNOSIS — R109 Unspecified abdominal pain: Secondary | ICD-10-CM | POA: Insufficient documentation

## 2014-01-12 DIAGNOSIS — Z8249 Family history of ischemic heart disease and other diseases of the circulatory system: Secondary | ICD-10-CM | POA: Insufficient documentation

## 2014-01-12 HISTORY — DX: Anemia, unspecified: D64.9

## 2014-01-12 LAB — WET PREP, GENITAL
CLUE CELLS WET PREP: NONE SEEN
TRICH WET PREP: NONE SEEN
YEAST WET PREP: NONE SEEN

## 2014-01-12 LAB — URINE MICROSCOPIC-ADD ON

## 2014-01-12 LAB — URINALYSIS, ROUTINE W REFLEX MICROSCOPIC
BILIRUBIN URINE: NEGATIVE
GLUCOSE, UA: NEGATIVE mg/dL
Ketones, ur: 15 mg/dL — AB
Leukocytes, UA: NEGATIVE
Nitrite: NEGATIVE
PH: 6 (ref 5.0–8.0)
PROTEIN: NEGATIVE mg/dL
Specific Gravity, Urine: 1.02 (ref 1.005–1.030)
Urobilinogen, UA: 0.2 mg/dL (ref 0.0–1.0)

## 2014-01-12 LAB — COMPREHENSIVE METABOLIC PANEL
ALT: 10 U/L (ref 0–35)
AST: 18 U/L (ref 0–37)
Albumin: 3.8 g/dL (ref 3.5–5.2)
Alkaline Phosphatase: 50 U/L (ref 39–117)
BUN: 10 mg/dL (ref 6–23)
CO2: 23 mEq/L (ref 19–32)
CREATININE: 0.69 mg/dL (ref 0.50–1.10)
Calcium: 9.5 mg/dL (ref 8.4–10.5)
Chloride: 99 mEq/L (ref 96–112)
GFR calc Af Amer: >90 mL/min (ref >90)
GFR calc non Af Amer: >90 mL/min (ref >90)
GLUCOSE: 124 mg/dL — AB (ref 70–99)
Potassium: 3.6 mEq/L — ABNORMAL LOW (ref 3.7–5.3)
Sodium: 135 mEq/L — ABNORMAL LOW (ref 137–147)
TOTAL PROTEIN: 7.7 g/dL (ref 6.0–8.3)
Total Bilirubin: 0.4 mg/dL (ref 0.3–1.2)

## 2014-01-12 LAB — CBC
HEMATOCRIT: 34.2 % — AB (ref 36.0–46.0)
HEMOGLOBIN: 11.7 g/dL — AB (ref 12.0–15.0)
MCH: 27.7 pg (ref 26.0–34.0)
MCHC: 34.2 g/dL (ref 30.0–36.0)
MCV: 80.9 fL (ref 78.0–100.0)
Platelets: 227 10*3/uL (ref 150–400)
RBC: 4.23 MIL/uL (ref 3.87–5.11)
RDW: 13.9 % (ref 11.5–15.5)
WBC: 3.7 10*3/uL — AB (ref 4.0–10.5)

## 2014-01-12 LAB — HCG, QUANTITATIVE, PREGNANCY: hCG, Beta Chain, Quant, S: 1880 m[IU]/mL — ABNORMAL HIGH (ref <5)

## 2014-01-12 LAB — ABO/RH: ABO/RH(D): O POS

## 2014-01-12 MED ORDER — METHOTREXATE INJECTION FOR WOMEN'S HOSPITAL
50.0000 mg/m2 | Freq: Once | INTRAMUSCULAR | Status: AC
  Administered 2014-01-12: 90 mg via INTRAMUSCULAR
  Filled 2014-01-12: qty 1.8

## 2014-01-12 NOTE — MAU Note (Signed)
Pt states here for bleeding and lower abd pain. Is early pregnant. LMP-12/08/2013

## 2014-01-12 NOTE — MAU Note (Signed)
Early pregnant, found out Sat at urgent care.  LMP 12-12-13, pt states she is having lower abd cramping and "some" vaginal bleeding.  States it has been brown in color since noon today when wiping with toilet paper.  Not requiring sanitary pad.

## 2014-01-12 NOTE — MAU Note (Signed)
Patient has had a positive pregnancy test at Urgent Care. States she has been having abdominal cramping since last night, worse today. Had brown spotting at lunch time. Not wearing a pad.

## 2014-01-12 NOTE — MAU Provider Note (Signed)
History     CSN: [id number]  Arrival date and time: 01/12/14 1335   None     Chief Complaint  Patient presents with  . Abdominal Cramping   HPI This is a 28 y.o. female at [redacted]w[redacted]d who presents with c/o cramping and spotting (dk brown) for 1-2 days.  Never had red bleeding.   RN Note:  Early pregnant, found out Sat at urgent care. LMP 12-12-13, pt states she is having lower abd cramping and "some" vaginal bleeding. States it has been brown in color since noon today when wiping with toilet paper. Not requiring sanitary pad.        OB History   Grav Para Term Preterm Abortions TAB SAB Ect Mult Living   1               Past Medical History  Diagnosis Date  . Hypoglycemia     Past Surgical History  Procedure Laterality Date  . Hernia repair    . Cesarean section    . Umbilical hernia repair      Family History  Problem Relation Age of Onset  . Hyperlipidemia Mother   . Diabetes Father     History  Substance Use Topics  . Smoking status: Former Research scientist (life sciences)  . Smokeless tobacco: Not on file  . Alcohol Use: No    Allergies: No Known Allergies  Prescriptions prior to admission  Medication Sig Dispense Refill  . fluconazole (DIFLUCAN) 150 MG tablet Take 1 tablet (150 mg total) by mouth once. Repeat if needed  1 tablet  0  . ondansetron (ZOFRAN-ODT) 8 MG disintegrating tablet Take 1 tablet (8 mg total) by mouth every 8 (eight) hours as needed for nausea.  30 tablet  0  . Prenatal MV-Min-Fe Fum-FA-DHA (PRENATAL MULTIVITAMIN + DHA) 28-0.8 & 200 MG MISC 1 tab po qd  120 each  3  . valACYclovir (VALTREX) 1000 MG tablet Take 1 tablet (1,000 mg total) by mouth 2 (two) times daily.  60 tablet  3    Review of Systems  Constitutional: Negative for fever, chills and malaise/fatigue.  Gastrointestinal: Positive for abdominal pain (cramping). Negative for nausea, vomiting, diarrhea and constipation.  Genitourinary:       Brown spotting   Neurological: Negative for dizziness.    Physical Exam   Blood pressure 112/74, pulse 89, temperature 99.2 F (37.3 C), temperature source Oral, resp. rate 16, height 5\' 5"  (1.651 m), weight 71.215 kg (157 lb), last menstrual period 12/08/2013, SpO2 100.00%.  Physical Exam  Constitutional: She is oriented to person, place, and time. She appears well-developed and well-nourished. No distress.  HENT:  Head: Normocephalic.  Cardiovascular: Normal rate.   Respiratory: Effort normal.  GI: Soft. She exhibits no mass. There is no tenderness. There is no rebound and no guarding.  Genitourinary: Uterus normal. Vaginal discharge (Dark brown clotted blood at os, small to moderate amt, uterus small, adnexae nontender) found.  Musculoskeletal: Normal range of motion.  Neurological: She is alert and oriented to person, place, and time.  Skin: Skin is warm and dry.  Psychiatric: She has a normal mood and affect.    MAU Course  Procedures  MDM Will check cultures, labs and [place] Results for orders placed during the hospital encounter of 01/12/14 (from the past 24 hour(s))  URINALYSIS, ROUTINE W REFLEX MICROSCOPIC     Status: Abnormal   Collection Time    01/12/14  2:10 PM      Result Value Ref Range  Color, Urine YELLOW  YELLOW   APPearance HAZY (*) CLEAR   Specific Gravity, Urine 1.020  1.005 - 1.030   pH 6.0  5.0 - 8.0   Glucose, UA NEGATIVE  NEGATIVE mg/dL   Hgb urine dipstick LARGE (*) NEGATIVE   Bilirubin Urine NEGATIVE  NEGATIVE   Ketones, ur 15 (*) NEGATIVE mg/dL   Protein, ur NEGATIVE  NEGATIVE mg/dL   Urobilinogen, UA 0.2  0.0 - 1.0 mg/dL   Nitrite NEGATIVE  NEGATIVE   Leukocytes, UA NEGATIVE  NEGATIVE  URINE MICROSCOPIC-ADD ON     Status: Abnormal   Collection Time    01/12/14  2:10 PM      Result Value Ref Range   Squamous Epithelial / LPF FEW (*) RARE   RBC / HPF 0-2  <3 RBC/hpf   Bacteria, UA RARE  RARE  CBC     Status: Abnormal   Collection Time    01/12/14  3:25 PM      Result Value Ref Range   WBC 3.7  (*) 4.0 - 10.5 K/uL   RBC 4.23  3.87 - 5.11 MIL/uL   Hemoglobin 11.7 (*) 12.0 - 15.0 g/dL   HCT 34.2 (*) 36.0 - 46.0 %   MCV 80.9  78.0 - 100.0 fL   MCH 27.7  26.0 - 34.0 pg   MCHC 34.2  30.0 - 36.0 g/dL   RDW 13.9  11.5 - 15.5 %   Platelets 227  150 - 400 K/uL  HCG, QUANTITATIVE, PREGNANCY     Status: Abnormal   Collection Time    01/12/14  4:25 PM      Result Value Ref Range   hCG, Beta Chain, Quant, S 1880 (*) <5 mIU/mL  ABO/RH     Status: None   Collection Time    01/12/14  4:25 PM      Result Value Ref Range   ABO/RH(D) O POS    WET PREP, GENITAL     Status: Abnormal   Collection Time    01/12/14  4:28 PM      Result Value Ref Range   Yeast Wet Prep HPF POC NONE SEEN  NONE SEEN   Trich, Wet Prep NONE SEEN  NONE SEEN   Clue Cells Wet Prep HPF POC NONE SEEN  NONE SEEN   WBC, Wet Prep HPF POC FEW (*) NONE SEEN   US Ob Comp Less 14 Wks  01/12/2014   CLINICAL DATA:  bleeding, cramping early preg, Quant 1880  EXAM: OBSTETRIC <14 WK [place] AND TRANSVAGINAL OB US  TECHNIQUE: Both transabdominal and transvaginal ultrasound examinations were performed for complete evaluation of the gestation as well as the maternal uterus, adnexal regions, and pelvic cul-de-sac. Transvaginal technique was performed to assess early pregnancy.  COMPARISON:  None.    FINDINGS: Intrauterine gestational sac: Not visualized  Yolk sac: Not present  Embryo: Not visualized.  Cardiac Activity: Not appreciated.  Heart Rate: N/A bpm  Maternal uterus/adnexae: In the left adnexal region, a 1.6 x 1.8 cm nodule with heterogeneous echogenicity and peripheral vascularity is appreciated adjacent to the left ovary. A moderate amount of free fluid is appreciated within the pelvis.  The right and left ovaries are otherwise unremarkable. A submucosal fibroid in the uterus measuring 2.5 x 2.1 x 1.7 cm.    IMPRESSION: Findings consistent with an ectopic pregnancy in the left adnexal region to proven otherwise.   OB consultation  recommended. These findings were related to Dr. Hansel Feinstein, by the  sonographic technologist Rodrigo Ran at the time and date of this interpretation. Submucosal fibroid   Electronically Signed   By: Margaree Mackintosh M.D.   On: 01/12/2014 18:51   Discussed with Dr Gala Romney.  Will initiate methotrexate protocol. Results for Amanda [name], Amanda [name] (MRN [id number]) as of 01/12/2014 20:10  Ref. Range 01/12/2014 16:25  Creatinine Latest Range: 0.50-1.10 mg/dL 0.69   Assessment and Plan  A:  Ectopic Pregnancy, Left       Quant HCG = 1880  P:  Methotrexate given       Day 4 labs put in as sign and hold.        Ectopic precautions.   Seabron Spates 01/12/2014, 4:09 PM

## 2014-01-12 NOTE — Discharge Instructions (Signed)
Ectopic Pregnancy An ectopic pregnancy is when the fertilized egg attaches (implants) outside the uterus. Most ectopic pregnancies occur in the fallopian tube. Rarely do ectopic pregnancies occur on the ovary, intestine, pelvis, or cervix. In an ectopic pregnancy, the fertilized egg does not have the ability to develop into a normal, healthy baby.  A ruptured ectopic pregnancy is one in which the fallopian tube gets torn or bursts and results in internal bleeding. Often there is intense abdominal pain, and sometimes, vaginal bleeding. Having an ectopic pregnancy can be life threatening. If left untreated, this dangerous condition can lead to a blood transfusion, abdominal surgery, or even death. CAUSES  Damage to the fallopian tubes is the suspected cause in most ectopic pregnancies.  RISK FACTORS Depending on your circumstances, the risk of having an ectopic pregnancy will vary. The level of risk can be divided into three categories. High Risk  You have gone through infertility treatment.  You have had a previous ectopic pregnancy.  You have had previous tubal surgery.  You have had previous surgery to have the fallopian tubes tied (tubal ligation).  You have tubal problems or diseases.  You have been exposed to DES. DES is a medicine that was used until 1971 and had effects on babies whose mothers took the medicine.  You become pregnant while using an intrauterine device (IUD) for birth control. Moderate Risk  You have a history of infertility.  You have a history of a sexually transmitted infection (STI).  You have a history of pelvic inflammatory disease (PID).  You have scarring from endometriosis.  You have multiple sexual partners.  You smoke. Low Risk  You have had previous pelvic surgery.  You use vaginal douching.  You became sexually active before 28 years of age. SIGNS AND SYMPTOMS  An ectopic pregnancy should be suspected in anyone who has missed a period and  has abdominal pain or bleeding.  You may experience normal pregnancy symptoms, such as:  Nausea.  Tiredness.  Breast tenderness.  Other symptoms may include:  Pain with intercourse.  Irregular vaginal bleeding or spotting.  Cramping or pain on one side or in the lower abdomen.  Fast heartbeat.  Passing out while having a bowel movement.  Symptoms of a ruptured ectopic pregnancy and internal bleeding may include:  Sudden, severe pain in the abdomen and pelvis.  Dizziness or fainting.  Pain in the shoulder area. DIAGNOSIS  Tests that may be performed include:  A pregnancy test.  An ultrasound test.  Testing the specific level of pregnancy hormone in the bloodstream.  Taking a sample of uterus tissue (dilation and curettage, D&C).  Surgery to perform a visual exam of the inside of the abdomen using a thin, lighted tube with a tiny camera on the end (laparoscope). TREATMENT  An injection of a medicine called methotrexate may be given. This medicine causes the pregnancy tissue to be absorbed. It is given if:  The diagnosis is made early.  The fallopian tube has not ruptured.  You are considered to be a good candidate for the medicine. Usually, pregnancy hormone blood levels are checked after methotrexate treatment. This is to be sure the medicine is effective. It may take 4 6 weeks for the pregnancy to be absorbed (though most pregnancies will be absorbed by 3 weeks). Surgical treatment may be needed. A laparoscope may be used to remove the pregnancy tissue. If severe internal bleeding occurs, a cut (incision) may be made in the lower abdomen (laparotomy), and the  ectopic pregnancy is removed. This stops the bleeding. Part of the fallopian tube, or the whole tube, may be removed as well (salpingectomy). After surgery, pregnancy hormone tests may be done to be sure there is no pregnancy tissue left. You may receive an Rho(D) immune globulin shot if you are Rh negative and  the father is Rh positive, or if you do not know the Rh type of the father. This is to prevent problems with any future pregnancy. SEEK IMMEDIATE MEDICAL CARE IF:  You have any symptoms of an ectopic pregnancy. This is a medical emergency. Document Released: 09/21/2004 Document Revised: 06/04/2013 Document Reviewed: 03/13/2013 [facility] Patient Information 2014 [place], [place].   Methotrexate Treatment for an Ectopic Pregnancy An ectopic pregnancy is when the fertilized egg attaches (implants) outside the uterus. Most ectopic pregnancies occur in the fallopian tube. Rarely do ectopic pregnancies occur on the ovary, intestine, pelvis, or cervix. An ectopic pregnancy does not have the ability to develop into a normal, healthy baby. Having an ectopic pregnancy can be a life-threatening experience. However, if the ectopic pregnancy is found early enough, it can be treated with a medicine. This medicine is called methotrexate. Methotrexate works by stopping the pregnancy from growing. It helps the body absorb the pregnancy tissue over a 2 to 6 week period (though most pregnancies will be absorbed by 3 weeks).  If methotrexate is successful, there is a good chance that the fallopian tube may be saved. Regardless of whether the fallopian tube is saved, a mother who has had an ectopic pregnancy is at a much higher risk of having another ectopic occur in future pregnancies. One serious concern is the potential for the fallopian tube to tear (rupture). If it does, emergency surgery is needed to remove the pregnancy, and methotrexate cannot be used. The ideal patient for methotrexate is a person who is:   Not bleeding internally.  Has no severe or persistent abdominal pain.  Is committed to following through with lab tests and appointments until the ectopic has absorbed.  Is healthy and has normal liver and kidney functions on evaluation. Methotrexate should not be given to women who:  Are  breastfeeding.  Have a normal pregnancy (intrauterine pregnancy).  Have liver, lung, or kidney disease.  Have blood problems.  Are allergic to methotrexate.  Have peptic ulcers.  Have an ectopic pregnancy larger than 1 inches (3.5 cm) or one that has fetal heartbeats. This is a rule that is followed most of the time (relative contraindication). BEFORE THE TREATMENT Before giving the medicine:  Liver tests, kidney tests, and a complete blood test are performed.  Blood tests are performed to measure the pregnancy hormone levels and to determine the mother's blood type.  If the woman is Rh negative, and the father is Rh positive or his Rh type is not known, a RhoGAM shot is given. TREATMENT  There are 2 methods that your caregiver may use to prescribe methotrexate. One method involves a single dose or injection of the medicine. Another method involves a series of doses. This method involves several injections.  AFTER THE TREATMENT Blood tests will be taken for several weeks to check the pregnancy hormone levels. The blood tests are performed until there is no more pregnancy hormone detected in the blood. There is still a risk of the ectopic pregnancy rupturing while using the methotrexate. There are also side effects of methotrexate, which include:   Nausea and vomiting.  Mouth sores.  Diarrhea.  Rash.  Dizziness.  Increased abdominal pain.  Increased vaginal bleeding or spotting.  Pneumonia.  Failed treatment.  Hair loss. This is rare and reversible. On very rare occasions, the medicine may affect your blood counts, liver, kidney, bone marrow, or hormone levels. If this happens, your caregiver will want to perform further evaluations. Document Released: 08/08/2001 Document Revised: 11/06/2011 Document Reviewed: 04/20/2011 [facility] Patient Information 2014 [place], [place].   Pelvic Rest Pelvic rest is sometimes recommended for women when:   The placenta is partially  or completely covering the opening of the cervix (placenta previa).  There is bleeding between the uterine wall and the amniotic sac in the first trimester (subchorionic hemorrhage).  The cervix begins to open without labor starting (incompetent cervix, cervical insufficiency).  The labor is too early (preterm labor). HOME CARE INSTRUCTIONS  Do not have sexual intercourse, stimulation, or an orgasm.  Do not use tampons, douche, or put anything in the vagina.  Do not lift anything over 10 pounds (4.5 kg).  Avoid strenuous activity or straining your pelvic muscles. SEEK MEDICAL CARE IF:  You have any vaginal bleeding during pregnancy. Treat this as a potential emergency.  You have cramping pain felt low in the stomach (stronger than menstrual cramps).  You notice vaginal discharge (watery, mucus, or bloody).  You have a low, dull backache.  There are regular contractions or uterine tightening. SEEK IMMEDIATE MEDICAL CARE IF: You have vaginal bleeding and have placenta previa.  Document Released: 12/09/2010 Document Revised: 11/06/2011 Document Reviewed: 12/09/2010 [facility] Patient Information 2014 [place].

## 2014-01-13 LAB — GC/CHLAMYDIA PROBE AMP
CT PROBE, AMP APTIMA: NEGATIVE
GC Probe RNA: NEGATIVE

## 2014-01-14 ENCOUNTER — Inpatient Hospital Stay (HOSPITAL_COMMUNITY)
Admission: AD | Admit: 2014-01-14 | Discharge: 2014-01-14 | Disposition: A | Discharge status: Home / Self Care | Payer: BC Managed Care – PPO | Source: Ambulatory Visit | Attending: Obstetrics & Gynecology | Admitting: Obstetrics & Gynecology

## 2014-01-14 ENCOUNTER — Inpatient Hospital Stay (HOSPITAL_COMMUNITY): Payer: BC Managed Care – PPO

## 2014-01-14 ENCOUNTER — Encounter (HOSPITAL_COMMUNITY): Payer: Self-pay

## 2014-01-14 DIAGNOSIS — O00109 Unspecified tubal pregnancy without intrauterine pregnancy: Secondary | ICD-10-CM | POA: Insufficient documentation

## 2014-01-14 DIAGNOSIS — R109 Unspecified abdominal pain: Secondary | ICD-10-CM | POA: Insufficient documentation

## 2014-01-14 DIAGNOSIS — O009 Unspecified ectopic pregnancy without intrauterine pregnancy: Secondary | ICD-10-CM

## 2014-01-14 DIAGNOSIS — Z87891 Personal history of nicotine dependence: Secondary | ICD-10-CM | POA: Insufficient documentation

## 2014-01-14 LAB — CBC
HCT: 35 % — ABNORMAL LOW (ref 36.0–46.0)
HEMOGLOBIN: 12.1 g/dL (ref 12.0–15.0)
MCH: 28.1 pg (ref 26.0–34.0)
MCHC: 34.6 g/dL (ref 30.0–36.0)
MCV: 81.2 fL (ref 78.0–100.0)
Platelets: 215 10*3/uL (ref 150–400)
RBC: 4.31 MIL/uL (ref 3.87–5.11)
RDW: 13.9 % (ref 11.5–15.5)
WBC: 4.5 10*3/uL (ref 4.0–10.5)

## 2014-01-14 LAB — HCG, QUANTITATIVE, PREGNANCY: hCG, Beta Chain, Quant, S: 2284 m[IU]/mL — ABNORMAL HIGH (ref <5)

## 2014-01-14 MED ORDER — OXYCODONE-ACETAMINOPHEN 5-325 MG PO TABS: 1.0000 | ORAL_TABLET | ORAL | Status: DC | PRN

## 2014-01-14 MED ORDER — KETOROLAC TROMETHAMINE 30 MG/ML IJ SOLN
60.0000 mg | Freq: Once | INTRAMUSCULAR | Status: AC
  Administered 2014-01-14: 60 mg via INTRAMUSCULAR
  Filled 2014-01-14 (×2): qty 2

## 2014-01-14 NOTE — MAU Provider Note (Signed)
Attestation of Attending Supervision of Advanced Practitioner (CNM/NP): Evaluation and management procedures were performed by the Advanced Practitioner under my supervision and collaboration. I have reviewed the Advanced Practitioner's note and chart, and I agree with the management and plan.  Cady Hafen H Johnney Scarlata 3:19 PM   

## 2014-01-14 NOTE — Discharge Instructions (Signed)
Ectopic Pregnancy An ectopic pregnancy is when the fertilized egg attaches (implants) outside the uterus. Most ectopic pregnancies occur in the fallopian tube. Rarely do ectopic pregnancies occur on the ovary, intestine, pelvis, or cervix. In an ectopic pregnancy, the fertilized egg does not have the ability to develop into a normal, healthy baby.  A ruptured ectopic pregnancy is one in which the fallopian tube gets torn or bursts and results in internal bleeding. Often there is intense abdominal pain, and sometimes, vaginal bleeding. Having an ectopic pregnancy can be life threatening. If left untreated, this dangerous condition can lead to a blood transfusion, abdominal surgery, or even death. CAUSES  Damage to the fallopian tubes is the suspected cause in most ectopic pregnancies.  RISK FACTORS Depending on your circumstances, the risk of having an ectopic pregnancy will vary. The level of risk can be divided into three categories. High Risk  You have gone through infertility treatment.  You have had a previous ectopic pregnancy.  You have had previous tubal surgery.  You have had previous surgery to have the fallopian tubes tied (tubal ligation).  You have tubal problems or diseases.  You have been exposed to DES. DES is a medicine that was used until 1971 and had effects on babies whose mothers took the medicine.  You become pregnant while using an intrauterine device (IUD) for birth control. Moderate Risk  You have a history of infertility.  You have a history of a sexually transmitted infection (STI).  You have a history of pelvic inflammatory disease (PID).  You have scarring from endometriosis.  You have multiple sexual partners.  You smoke. Low Risk  You have had previous pelvic surgery.  You use vaginal douching.  You became sexually active before 28 years of age. SIGNS AND SYMPTOMS  An ectopic pregnancy should be suspected in anyone who has missed a period and  has abdominal pain or bleeding.  You may experience normal pregnancy symptoms, such as:  Nausea.  Tiredness.  Breast tenderness.  Other symptoms may include:  Pain with intercourse.  Irregular vaginal bleeding or spotting.  Cramping or pain on one side or in the lower abdomen.  Fast heartbeat.  Passing out while having a bowel movement.  Symptoms of a ruptured ectopic pregnancy and internal bleeding may include:  Sudden, severe pain in the abdomen and pelvis.  Dizziness or fainting.  Pain in the shoulder area. DIAGNOSIS  Tests that may be performed include:  A pregnancy test.  An ultrasound test.  Testing the specific level of pregnancy hormone in the bloodstream.  Taking a sample of uterus tissue (dilation and curettage, D&C).  Surgery to perform a visual exam of the inside of the abdomen using a thin, lighted tube with a tiny camera on the end (laparoscope). TREATMENT  An injection of a medicine called methotrexate may be given. This medicine causes the pregnancy tissue to be absorbed. It is given if:  The diagnosis is made early.  The fallopian tube has not ruptured.  You are considered to be a good candidate for the medicine. Usually, pregnancy hormone blood levels are checked after methotrexate treatment. This is to be sure the medicine is effective. It may take 4 6 weeks for the pregnancy to be absorbed (though most pregnancies will be absorbed by 3 weeks). Surgical treatment may be needed. A laparoscope may be used to remove the pregnancy tissue. If severe internal bleeding occurs, a cut (incision) may be made in the lower abdomen (laparotomy), and the  ectopic pregnancy is removed. This stops the bleeding. Part of the fallopian tube, or the whole tube, may be removed as well (salpingectomy). After surgery, pregnancy hormone tests may be done to be sure there is no pregnancy tissue left. You may receive an Rho(D) immune globulin shot if you are Rh negative and  the father is Rh positive, or if you do not know the Rh type of the father. This is to prevent problems with any future pregnancy. °SEEK IMMEDIATE MEDICAL CARE IF:  °You have any symptoms of an ectopic pregnancy. This is a medical emergency. °Document Released: 09/21/2004 Document Revised: 06/04/2013 Document Reviewed: 03/13/2013 °[employer]® Patient Information ©2014 [employer]. ° °

## 2014-01-14 NOTE — MAU Note (Signed)
Pt presents complaining of left lower quadrant pain that started this am and got worse this afternoon. Diagnosed with ectopic pregnancy in left tube on Monday and given methotrexate. Complains of heavy vaginal bleeding.

## 2014-01-14 NOTE — MAU Provider Note (Signed)
Chief Complaint: Abdominal Pain      SUBJECTIVE HPI: Amanda [name] is a 28 y.o. G4P1021 at [redacted]w[redacted]d by LMP who presents now 2 days s/p MTX for left ectopic with severe suprapubic abdominal pain that doubles her over. Onset of pain 3 hrs PTA and increasing in intensity. Denies orthostatic symptoms. Her bleeding has picked up a little but is still light. Last ate at 12 noon.  Past Medical History  Diagnosis Date  . Hypoglycemia   . Anemia    OB History  Gravida Para Term Preterm AB SAB TAB Ectopic Multiple Living  4 1 1  2 1 1   1     # Outcome Date GA Lbr Len/2nd Weight Sex Delivery Anes PTL Lv  4 CUR           3 TRM           2 TAB           1 SAB              Past Surgical History  Procedure Laterality Date  . Hernia repair    . Umbilical hernia repair    . Cesarean section      FTP   History   Social History  . Marital Status: Single    Spouse Name: N/A    Number of Children: N/A  . Years of Education: N/A   Occupational History  . Not on file.   Social History Main Topics  . Smoking status: Former Research scientist (life sciences)  . Smokeless tobacco: Never Used  . Alcohol Use: No  . Drug Use: No  . Sexual Activity: No   Other Topics Concern  . Not on file   Social History Narrative  . No narrative on file   No current facility-administered medications on file prior to encounter.   Current Outpatient Prescriptions on File Prior to Encounter  Medication Sig Dispense Refill  . valACYclovir (VALTREX) 500 MG tablet Take 500 mg by mouth daily.       No Known Allergies  ROS: Pertinent items in HPI  OBJECTIVE Blood pressure 100/67, pulse 90, temperature 98.9 F (37.2 C), temperature source Oral, resp. rate 18, last menstrual period 12/08/2013, SpO2 99.00%. GENERAL: Well-developed, well-nourished female in no acute distress.  HEENT: Normocephalic HEART: normal rate RESP: normal effort ABDOMEN: Soft, non-tender EXTREMITIES: Nontender, no edema NEURO: Alert and oriented  LAB  RESULTS Results for orders placed during the hospital encounter of 01/14/14 (from the past 24 hour(s))  CBC     Status: Abnormal   Collection Time    01/14/14  3:55 PM      Result Value Ref Range   WBC 4.5  4.0 - 10.5 K/uL   RBC 4.31  3.87 - 5.11 MIL/uL   Hemoglobin 12.1  12.0 - 15.0 g/dL   HCT 35.0 (*) 36.0 - 46.0 %   MCV 81.2  78.0 - 100.0 fL   MCH 28.1  26.0 - 34.0 pg   MCHC 34.6  30.0 - 36.0 g/dL   RDW 13.9  11.5 - 15.5 %   Platelets 215  150 - 400 K/uL  HCG, QUANTITATIVE, PREGNANCY     Status: Abnormal   Collection Time    01/14/14  3:55 PM      Result Value Ref Range   hCG, Beta Chain, Quant, S 2284 (*) <5 mIU/mL    IMAGING US Ob Comp Less 14 Wks  01/12/2014   CLINICAL DATA:  bleeding, cramping early preg, Quant 1880  EXAM: OBSTETRIC <14 WK [place] AND TRANSVAGINAL OB US  TECHNIQUE: Both transabdominal and transvaginal ultrasound examinations were performed for complete evaluation of the gestation as well as the maternal uterus, adnexal regions, and pelvic cul-de-sac. Transvaginal technique was performed to assess early pregnancy.  COMPARISON:  None.  FINDINGS: Intrauterine gestational sac: Not visualized  Yolk sac: Not present  Embryo: Not visualized.  Cardiac Activity: Not appreciated.  Heart Rate: N/A bpm  Maternal uterus/adnexae: In the left adnexal region, a 1.6 x 1.8 cm nodule with heterogeneous echogenicity and peripheral vascularity is appreciated adjacent to the left ovary. A moderate amount of free fluid is appreciated within the pelvis.  The right and left ovaries are otherwise unremarkable. A submucosal fibroid in the uterus measuring 2.5 x 2.1 x 1.7 cm.  IMPRESSION: Findings consistent with an ectopic pregnancy in the left adnexal region to proven otherwise. OB consultation recommended. These findings were related to Dr. Hansel Feinstein, by the sonographic technologist Rodrigo Ran at the time and date of this interpretation. Submucosal fibroid   Electronically Signed   By:  Margaree Mackintosh M.D.   On: 01/12/2014 18:51  US Ob Comp Less 14 Wks  01/12/2014   CLINICAL DATA:  bleeding, cramping early preg, Quant 1880  EXAM: OBSTETRIC <14 WK [place] AND TRANSVAGINAL OB US  TECHNIQUE: Both transabdominal and transvaginal ultrasound examinations were performed for complete evaluation of the gestation as well as the maternal uterus, adnexal regions, and pelvic cul-de-sac. Transvaginal technique was performed to assess early pregnancy.  COMPARISON:  None.  FINDINGS: Intrauterine gestational sac: Not visualized  Yolk sac: Not present  Embryo: Not visualized.  Cardiac Activity: Not appreciated.  Heart Rate: N/A bpm  Maternal uterus/adnexae: In the left adnexal region, a 1.6 x 1.8 cm nodule with heterogeneous echogenicity and peripheral vascularity is appreciated adjacent to the left ovary. A moderate amount of free fluid is appreciated within the pelvis.  The right and left ovaries are otherwise unremarkable. A submucosal fibroid in the uterus measuring 2.5 x 2.1 x 1.7 cm.  IMPRESSION: Findings consistent with an ectopic pregnancy in the left adnexal region to proven otherwise. OB consultation recommended. These findings were related to Dr. Hansel Feinstein, by the sonographic technologist Rodrigo Ran at the time and date of this interpretation. Submucosal fibroid   Electronically Signed   By: Margaree Mackintosh M.D.   On: 01/12/2014 18:51   US Ob Transvaginal  01/14/2014   CLINICAL DATA:  Patient with known left adnexal ectopic pregnancy.  EXAM: TRANSVAGINAL OB ULTRASOUND  TECHNIQUE: Transvaginal ultrasound was performed for complete evaluation of the gestation as well as the maternal uterus, adnexal regions, and pelvic cul-de-sac.  COMPARISON:  US OB TRANSVAGINAL dated 01/12/2014  FINDINGS: Intrauterine gestational sac: Not present  Yolk sac: Not present  Maternal uterus/adnexae: Within the left adnexa there is a 2.1 x 1.8 cm mass with central gestational sac and probable yolk sac, compatible with  ectopic pregnancy. No significant surrounding free fluid. The right ovary is grossly unremarkable. No intrauterine gestational sac or intrauterine pregnancy is identified. Probable Bartholin gland cyst. Probable submucosal fibroid is again re- demonstrated measuring 2.8 x 1.6 x 2.1 cm.  IMPRESSION: Findings compatible with ectopic pregnancy within the left adnexa.  Critical Value/emergent results were called by telephone at the time of interpretation on 01/14/2014 at 4:46 PM to Dr. Darlina Rumpf Elven Laboy , who verbally acknowledged these results.   Electronically Signed   By: Lovey Newcomer M.D.   On: 01/14/2014 16:48   US Ob  Transvaginal  01/12/2014   CLINICAL DATA:  bleeding, cramping early preg, Quant 1880  EXAM: TRANSVAGINAL OB ULTRASOUND  TECHNIQUE: Transvaginal ultrasound was performed for complete evaluation of the gestation as well as the maternal uterus, adnexal regions, and pelvic cul-de-sac.  COMPARISON:  None.  FINDINGS: Intrauterine gestational sac: Not visualized  Yolk sac:  Not present  Embryo:  Not visualized.  Cardiac Activity: Not appreciated.  Heart Rate: N/A bpm  Maternal uterus/adnexae: In the left adnexal region, a 1.6 x 1.8 cm nodule with heterogeneous echogenicity and peripheral vascularity is appreciated adjacent to the left ovary. A moderate amount of free fluid is appreciated within the pelvis.  The right and left ovaries are otherwise unremarkable. A submucosal fibroid in the uterus measuring 2.5 x 2.1 x 1.7 cm.  IMPRESSION: Findings consistent with an ectopic pregnancy in the left adnexal region to proven otherwise. OB consultation recommended. These findings were related to Dr. Hansel Feinstein, by the sonographic technologist Rodrigo Ran at the time and date of this interpretation.  Submucosal fibroid   Electronically Signed   By: Margaree Mackintosh M.D.   On: 01/12/2014 18:15   MAU COURSE  1540: C/W Dr. Roselie Awkward. Will get [place] and labs.  C/W Dr. Elonda Husky with results: Home with precautions and F/U  quant in 5 days  ASSESSMENT 1. EP (ectopic pregnancy)   D#2 s/p MTX with abdominal pain  PLAN Discharge home with ectopic precautions    Medication List         fluconazole 150 MG tablet  Commonly known as:  DIFLUCAN  Take 150 mg by mouth once.     metroNIDAZOLE 500 MG tablet  Commonly known as:  FLAGYL  Take 500 mg by mouth 2 (two) times daily.     mupirocin ointment 2 %  Commonly known as:  BACTROBAN  Apply 1 application topically 2 (two) times daily.     oxyCODONE-acetaminophen 5-325 MG per tablet  Commonly known as:  PERCOCET/ROXICET  Take 1 tablet by mouth every 4 (four) hours as needed.     valACYclovir 500 MG tablet  Commonly known as:  VALTREX  Take 500 mg by mouth daily.       Follow-up Information   Follow up with Nursepractioner Mau, NP On 01/19/2014. (repeat [place])       Lorene Dy, CNM 01/14/2014  3:43 PM

## 2014-01-16 ENCOUNTER — Inpatient Hospital Stay (HOSPITAL_COMMUNITY)
Admission: AD | Admit: 2014-01-16 | Discharge: 2014-01-17 | Disposition: A | Discharge status: Home / Self Care | Payer: BC Managed Care – PPO | Source: Ambulatory Visit | Attending: Obstetrics & Gynecology | Admitting: Obstetrics & Gynecology

## 2014-01-16 ENCOUNTER — Inpatient Hospital Stay (HOSPITAL_COMMUNITY): Payer: BC Managed Care – PPO

## 2014-01-16 ENCOUNTER — Encounter (HOSPITAL_COMMUNITY): Payer: Self-pay | Admitting: *Deleted

## 2014-01-16 DIAGNOSIS — Z87891 Personal history of nicotine dependence: Secondary | ICD-10-CM | POA: Insufficient documentation

## 2014-01-16 DIAGNOSIS — O00109 Unspecified tubal pregnancy without intrauterine pregnancy: Secondary | ICD-10-CM | POA: Insufficient documentation

## 2014-01-16 DIAGNOSIS — O009 Unspecified ectopic pregnancy without intrauterine pregnancy: Secondary | ICD-10-CM

## 2014-01-16 DIAGNOSIS — R109 Unspecified abdominal pain: Secondary | ICD-10-CM | POA: Insufficient documentation

## 2014-01-16 LAB — CBC
HCT: 32.5 % — ABNORMAL LOW (ref 36.0–46.0)
HEMOGLOBIN: 11 g/dL — AB (ref 12.0–15.0)
MCH: 27.6 pg (ref 26.0–34.0)
MCHC: 33.8 g/dL (ref 30.0–36.0)
MCV: 81.7 fL (ref 78.0–100.0)
Platelets: 188 10*3/uL (ref 150–400)
RBC: 3.98 MIL/uL (ref 3.87–5.11)
RDW: 13.8 % (ref 11.5–15.5)
WBC: 3.6 10*3/uL — ABNORMAL LOW (ref 4.0–10.5)

## 2014-01-16 MED ORDER — HYDROMORPHONE HCL PF 1 MG/ML IJ SOLN
1.0000 mg | Freq: Once | INTRAMUSCULAR | Status: AC
  Administered 2014-01-16: 1 mg via INTRAMUSCULAR
  Filled 2014-01-16: qty 1

## 2014-01-16 NOTE — MAU Provider Note (Signed)
History     CSN: [id number]  Arrival date and time: 01/16/14 2238   First Provider Initiated Contact with Patient 01/16/14 2303      No chief complaint on file.  HPI [name] is a 28 y.o. [id number] at [redacted]w[redacted]d who presents to MAU today with complaint of worsening pain and heavier vaginal bleeding. The patient was seen on 01/12/14 for spotting and cramping and US showed a left adnexal mass concerning for ectopic pregnancy measuring 1.5 x 1.8 cm. The patient was given MTX that day. She returned on 01/14/14 with complaint of worsening pain. She states light bleeding at that time. She had another [place] which showed a left adnexal gestational sac with probably yolk sac measuring 2.1 x 1.8 cm. The patient returns tonight stating that around 1730 her pain became severe. She took Percocet at that time and again at 2130 without relief. She states that bleeding became heavier around 1700 today as well. She has used 3 super pads today. She states that her pain is in the left suprapubic region. She denies fever, N/V, weakness, fatigue or dizziness.   OB History   Grav Para Term Preterm Abortions TAB SAB Ect Mult Living   4 1 1  2 1 1   1       Past Medical History  Diagnosis Date  . Hypoglycemia   . Anemia     Past Surgical History  Procedure Laterality Date  . Hernia repair    . Umbilical hernia repair    . Cesarean section      FTP    Family History  Problem Relation Age of Onset  . Hyperlipidemia Mother   . Diabetes Father     History  Substance Use Topics  . Smoking status: Former Research scientist (life sciences)  . Smokeless tobacco: Never Used  . Alcohol Use: No    Allergies: No Known Allergies  Prescriptions prior to admission  Medication Sig Dispense Refill  . oxyCODONE-acetaminophen (PERCOCET/ROXICET) 5-325 MG per tablet Take 1 tablet by mouth every 4 (four) hours as needed.  20 tablet  0  . valACYclovir (VALTREX) 500 MG tablet Take 500 mg by mouth daily.      . fluconazole (DIFLUCAN) 150 MG  tablet Take 150 mg by mouth once.      . metroNIDAZOLE (FLAGYL) 500 MG tablet Take 500 mg by mouth 2 (two) times daily.      . mupirocin ointment (BACTROBAN) 2 % Apply 1 application topically 2 (two) times daily.        Review of Systems  Constitutional: Negative for fever and malaise/fatigue.  Gastrointestinal: Positive for abdominal pain. Negative for nausea, vomiting and diarrhea.  Genitourinary:       + vaginal bleeding  Neurological: Negative for dizziness and weakness.   Physical Exam   Blood pressure 114/78, pulse 85, temperature 98.3 F (36.8 C), temperature source Oral, resp. rate 20, last menstrual period 12/08/2013.  Physical Exam  Constitutional: She is oriented to person, place, and time. She appears well-developed and well-nourished. No distress.  HENT:  Head: Normocephalic and atraumatic.  Cardiovascular: Normal rate.   Respiratory: Effort normal.  GI: Soft. She exhibits no distension and no mass. There is tenderness (moderate tenderness to palpation of the LLQ). There is no rebound and no guarding.  Neurological: She is alert and oriented to person, place, and time.  Skin: Skin is warm and dry. No erythema.  Psychiatric: She has a normal mood and affect.   Results for orders placed during  the hospital encounter of 01/16/14 (from the past 24 hour(s))  HCG, QUANTITATIVE, PREGNANCY     Status: Abnormal   Collection Time    01/16/14 11:20 PM      Result Value Ref Range   hCG, Beta Chain, Quant, S 1071 (*) <5 mIU/mL  CBC     Status: Abnormal   Collection Time    01/16/14 11:20 PM      Result Value Ref Range   WBC 3.6 (*) 4.0 - 10.5 K/uL   RBC 3.98  3.87 - 5.11 MIL/uL   Hemoglobin 11.0 (*) 12.0 - 15.0 g/dL   HCT 32.5 (*) 36.0 - 46.0 %   MCV 81.7  78.0 - 100.0 fL   MCH 27.6  26.0 - 34.0 pg   MCHC 33.8  30.0 - 36.0 g/dL   RDW 13.8  11.5 - 15.5 %   Platelets 188  150 - 400 K/uL    US Ob Transvaginal  01/17/2014   CLINICAL DATA:  Ectopic pregnancy.  Increasing  pain.  EXAM: TRANSVAGINAL OB ULTRASOUND  TECHNIQUE: Transvaginal ultrasound was performed for complete evaluation of the gestation as well as the maternal uterus, adnexal regions, and pelvic cul-de-sac.  COMPARISON:  01/14/2014  FINDINGS: Intrauterine gestational sac: None  Yolk sac:  No  Embryo:  No  Cardiac Activity: No  Maternal uterus/adnexae: The ovaries are not visualized. The ectopic pregnancy in the right adnexa seen on the prior study is not visible on the current study. Neither ovary could be visualized. There is no free fluid.  IMPRESSION: Negative exam. Previously demonstrated ectopic pregnancy is not appreciable. The ovaries are not identified.   Electronically Signed   By: Rozetta Nunnery M.D.   On: 01/17/2014 00:47    MAU Course  Procedures None  MDM Discussed patient and recent history with Dr. Ihor Dow. She would like CBC and quant hCG today. She will come to MAU to evaluate the patient.  Korea today to evaluate ectopic pregnancy and possible free fluid in the pelvis Discussed lab and [place] results with Dr. Ihor Dow. Patient is stable for discharge at this time. Continue Percocet PRN for pain and return if worse.  Assessment and Plan  A: Ectopic pregnancy s/p MTX  P: Discharge home Patient advised to continue Percocet PRN for pain; 1-2 q 4 hours PRN Patient referred to [facility] clinic for follow-up labs in 1 week Patient advised to return to MAU if pain is severe, bleeding become heavy or she develops fever Patient may return to MAU as needed otherwise  Farris Has, PA-C  01/17/2014, 1:02 AM

## 2014-01-16 NOTE — MAU Note (Signed)
PT  HAS RECEIVED  METHOTREXATE  - WITH 2 QUANT.    HAS BEEN HAVING  ABD PAIN.-  BEARABLE   THEN  AT 530PM-  PAIN WORSE-   TOOK    PERCOCET AT 5PM, 930PM-  NO RELIEF.  BLEEDING   BECAME  MORE AT 5PM  -  PAD WORN IN -  SMALL AMT.

## 2014-01-17 DIAGNOSIS — O009 Unspecified ectopic pregnancy without intrauterine pregnancy: Secondary | ICD-10-CM

## 2014-01-17 LAB — HCG, QUANTITATIVE, PREGNANCY: hCG, Beta Chain, Quant, S: 1071 m[IU]/mL — ABNORMAL HIGH (ref <5)

## 2014-01-17 MED ORDER — HYDROMORPHONE HCL PF 1 MG/ML IJ SOLN
1.0000 mg | Freq: Once | INTRAMUSCULAR | Status: AC
  Administered 2014-01-17: 1 mg via INTRAMUSCULAR
  Filled 2014-01-17: qty 1

## 2014-01-17 NOTE — MAU Provider Note (Signed)
Pt was seen and examined by me as well. Tenderness on exam but, no peritoneal signs.  Has only taken 2 Percocet's total at home in over 24 hours.  She wants to avoid surgery 'if at all possible.'   After review of labs and sono it was decided with pt and her partner to continue following for resolution of sx.  It was explained to pt that sx can worsen around day #4.  She was given to s/sx for which to return.  She expressed understanding and all questions were answered.  Sirron Francesconi L. Harraway-Smith, M.D., Cherlynn June

## 2014-01-17 NOTE — Discharge Instructions (Signed)
Methotrexate Treatment for an Ectopic Pregnancy Care After Refer to this sheet in the next few weeks. These instructions provide you with information on caring for yourself after your treatment. Your caregiver may also give you more specific instructions. Your treatment has been planned according to current medical practices, but problems sometimes occur. Call your caregiver if you have any problems or questions after your treatment. HOME CARE INSTRUCTIONS  After you have received the methotrexate medicine, you need to be careful of your activities and watch your condition for several weeks. Even with methotrexate, an ectopic pregnancy can begin to bleed without warning. It may take 1 week before your pregnancy hormone level starts to drop.  Keep all follow-up blood work appointments as directed by your caregiver. This is very important. Blood work is usually done in 4 to 7 days, and then at weekly visits until there is no pregnancy hormone detected.  Avoid traveling too far away from your caregiver.  Do not have sexual intercourse until your caregiver says it is safe to do so.  You may resume your usual diet.  Limit strenuous activity.  Do not take folic acid, prenatal vitamins, or other vitamins that contain folic acid.  Do not take aspirin, ibuprofen, or naproxen (nonsteroidal anti-inflammatory drugs, NSAIDs).  Do not drink alcohol. SEEK MEDICAL CARE IF:   You cannot control your nausea and vomiting.  You cannot control your diarrhea.  You have sores in your mouth and want treatment.  You need pain medicine for your abdominal pain.  You have a rash.  You are having a reaction to the medicine. SEEK IMMEDIATE MEDICAL CARE IF:   You experience increasing abdominal or pelvic pain.  You notice increased bleeding.  You feel lightheaded or you faint.  You have shortness of breath.  Your heart rate increases.  You have a cough.  You have chills.  You have a fever. MAKE  SURE YOU:  Understand these instructions.  Will watch your condition.  Will get help right away if you are not doing well or get worse. Document Released: 08/03/2011 Document Revised: 11/06/2011 Document Reviewed: 08/03/2011 [facility] Patient Information 2014 [place], [place].

## 2014-01-23 ENCOUNTER — Other Ambulatory Visit: Payer: BC Managed Care – PPO

## 2014-01-23 DIAGNOSIS — Z3480 Encounter for supervision of other normal pregnancy, unspecified trimester: Secondary | ICD-10-CM

## 2014-01-24 LAB — HCG, QUANTITATIVE, PREGNANCY: HCG, BETA CHAIN, QUANT, S: 214.3 m[IU]/mL

## 2014-01-26 ENCOUNTER — Telehealth: Payer: Self-pay

## 2014-01-26 NOTE — Telephone Encounter (Signed)
Message copied by Geanie Logan on Mon Jan 26, 2014  8:48 AM ------      Message from: Farris Has      Created: Sat Jan 24, 2014  1:12 PM       Please call patient and inform her that her hCG levels continue to drop, but are not back to normal yet. She will need another lab draw on Friday, June 5th. Thanks! Almyra Free ------

## 2014-01-26 NOTE — Telephone Encounter (Signed)
Attempted to call patient. No answer. Left message stating we are calling with results and to set up an appointment, please call clinic.  

## 2014-01-27 NOTE — Telephone Encounter (Signed)
Attempted to call patient. No answer. Left message informing patient she has a repeat lab appointment on 01/30/14 at 0945 and to call clinic if she needs to reschedule. Will also send letter with appointment.

## 2014-01-30 ENCOUNTER — Other Ambulatory Visit: Payer: BC Managed Care – PPO

## 2014-03-31 ENCOUNTER — Other Ambulatory Visit: Payer: Self-pay | Admitting: Physician Assistant

## 2014-03-31 ENCOUNTER — Other Ambulatory Visit (HOSPITAL_COMMUNITY)
Admission: RE | Admit: 2014-03-31 | Discharge: 2014-03-31 | Disposition: A | Discharge status: Home / Self Care | Payer: BC Managed Care – PPO | Source: Ambulatory Visit | Attending: Physician Assistant | Admitting: Physician Assistant

## 2014-03-31 DIAGNOSIS — Z113 Encounter for screening for infections with a predominantly sexual mode of transmission: Secondary | ICD-10-CM | POA: Insufficient documentation

## 2014-03-31 DIAGNOSIS — Z124 Encounter for screening for malignant neoplasm of cervix: Secondary | ICD-10-CM | POA: Insufficient documentation

## 2014-03-31 DIAGNOSIS — Z1151 Encounter for screening for human papillomavirus (HPV): Secondary | ICD-10-CM | POA: Insufficient documentation

## 2014-04-02 LAB — CYTOLOGY - PAP

## 2014-04-07 NOTE — Progress Notes (Signed)
   Subjective:    Patient ID: Amanda [name], female    DOB: [birth year], 28 y.o.   MRN: [id number] Chief Complaint  Patient presents with  . Follow-up    wants a re check for vaginal itching  . Possible Pregnancy    took home pregnancy test and it was positive    HPI  Past Medical History  Diagnosis Date  . Hypoglycemia   . Anemia    No current outpatient prescriptions on file prior to visit.   No current facility-administered medications on file prior to visit.   No Known Allergies   Review of Systems  Constitutional: Positive for fatigue. Negative for fever, chills, activity change, appetite change and unexpected weight change.  Respiratory: Negative for shortness of breath.   Cardiovascular: Negative for palpitations.  Gastrointestinal: Positive for nausea and vomiting. Negative for abdominal pain.  Genitourinary: Negative for urgency, vaginal bleeding, vaginal discharge, vaginal pain and pelvic pain.  Neurological: Negative for dizziness and light-headedness.       Objective:  BP 106/82  Pulse 100  Temp(Src) 99.5 F (37.5 C) (Oral)  Resp 18  Ht 5' 5.25" (1.657 m)  Wt 158 lb 6.4 oz (71.85 kg)  BMI 26.17 kg/m2  SpO2 96%  LMP 12/08/2013  Physical Exam  Constitutional: She is oriented to person, place, and time. She appears well-developed and well-nourished. No distress.  HENT:  Head: Normocephalic and atraumatic.  Right Ear: External ear normal.  Eyes: Conjunctivae are normal. No scleral icterus.  Pulmonary/Chest: Effort normal.  Neurological: She is alert and oriented to person, place, and time.  Skin: Skin is warm and dry. She is not diaphoretic. No erythema.  Psychiatric: She has a normal mood and affect. Her behavior is normal.          Assessment & Plan:   Amenorrhea - Plan: POCT urine pregnancy  Vaginitis - Plan: POCT Wet Prep with KOH  Supervision of other normal pregnancy  Meds ordered this encounter  Medications  . DISCONTD: ondansetron  (ZOFRAN-ODT) 8 MG disintegrating tablet    Sig: Take 1 tablet (8 mg total) by mouth every 8 (eight) hours as needed for nausea.    Dispense:  30 tablet    Refill:  0  . DISCONTD: Prenatal MV-Min-Fe Fum-FA-DHA (PRENATAL MULTIVITAMIN + DHA) 28-0.8 & 200 MG MISC    Sig: 1 tab po qd    Dispense:  120 each    Refill:  3  . DISCONTD: fluconazole (DIFLUCAN) 150 MG tablet    Sig: Take 1 tablet (150 mg total) by mouth once. Repeat if needed    Dispense:  1 tablet    Refill:  0     Delman Cheadle, MD MPH

## 2014-06-29 ENCOUNTER — Encounter (HOSPITAL_COMMUNITY): Payer: Self-pay | Admitting: *Deleted

## 2014-07-11 ENCOUNTER — Ambulatory Visit (INDEPENDENT_AMBULATORY_CARE_PROVIDER_SITE_OTHER): Payer: BC Managed Care – PPO | Admitting: Internal Medicine

## 2014-07-11 VITALS — BP 118/72 | HR 82 | Temp 98.7°F | Resp 16 | Ht 65.25 in | Wt 157.4 lb

## 2014-07-11 DIAGNOSIS — N898 Other specified noninflammatory disorders of vagina: Secondary | ICD-10-CM

## 2014-07-11 DIAGNOSIS — A609 Anogenital herpesviral infection, unspecified: Secondary | ICD-10-CM

## 2014-07-11 LAB — POCT WET PREP WITH KOH
KOH Prep POC: NEGATIVE
RBC WET PREP PER HPF POC: NEGATIVE
TRICHOMONAS UA: NEGATIVE
Yeast Wet Prep HPF POC: NEGATIVE

## 2014-07-11 LAB — POCT URINALYSIS DIPSTICK
Bilirubin, UA: NEGATIVE
Blood, UA: NEGATIVE
Glucose, UA: NEGATIVE
KETONES UA: NEGATIVE
Leukocytes, UA: NEGATIVE
Nitrite, UA: NEGATIVE
PROTEIN UA: NEGATIVE
SPEC GRAV UA: 1.02
Urobilinogen, UA: 0.2
pH, UA: 7

## 2014-07-11 LAB — POCT UA - MICROSCOPIC ONLY
Bacteria, U Microscopic: NEGATIVE
Casts, Ur, LPF, POC: NEGATIVE
Crystals, Ur, HPF, POC: NEGATIVE
Mucus, UA: NEGATIVE
RBC, URINE, MICROSCOPIC: NEGATIVE
YEAST UA: NEGATIVE

## 2014-07-11 MED ORDER — FLUCONAZOLE 150 MG PO TABS: 150.0000 mg | ORAL_TABLET | Freq: Once | ORAL | Status: DC

## 2014-07-11 MED ORDER — VALACYCLOVIR HCL 1 G PO TABS: 1000.0000 mg | ORAL_TABLET | Freq: Two times a day (BID) | ORAL | Status: DC

## 2014-07-11 NOTE — Progress Notes (Signed)
Subjective:  This chart was scribed for Amanda Lin, MD by Molli Posey, Medical scribe. This patient was seen in ROOM 8 and the patient's care was started 9:43 AM.   Patient ID: Amanda [name], female    DOB: 1985/11/18, 28 y.o.   MRN: [id number]  Vaginal Itching The patient's pertinent negatives include no vaginal discharge. Associated symptoms include rash. Pertinent negatives include no dysuria or frequency.   HPI Comments: Amanda [name] is a 28 y.o. female who presents to [facility] complaining of vaginal itching that she noticed this past week. Pt states she thinks it is a possible bacterial yeast infection. 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 Pt reports she has had one before in May of 2015 and she is experiencing similar symptoms currently. She reports no discharge or dysuria. She states her last period was 11/4. She denies recent treatment with Abx. She does not believe her symptoms to be related to intercourse. She states she has been with her current partner for about 1 yearwithout outside partners or suspicion that he has other partners. She denies discharge, dysuria, or frequency.     There are no active problems to display for this patient. review of past chart is important:past history of positive herpes culture 5/8/2015with history suggesting past infections having used Valtrex////admission 01/12/2014 for ectopic pregnancy///negative chlamydia and gonorrhea at that time No Known Allergies Current Outpatient Prescriptions on File Prior to Visit  Medication Sig Dispense Refill  . fluconazole (DIFLUCAN) 150 MG tablet Take 150 mg by mouth once.    . metroNIDAZOLE (FLAGYL) 500 MG tablet Take 500 mg by mouth 2 (two) times daily.    . mupirocin ointment (BACTROBAN) 2 % Apply 1 application topically 2 (two) times daily.    Marland Kitchen oxyCODONE-acetaminophen (PERCOCET/ROXICET) 5-325 MG per tablet Take 1 tablet by mouth every 4 (four) hours as  needed. 20 tablet 0  . valACYclovir (VALTREX) 500 MG tablet Take 500 mg by mouth daily.     No current facility-administered medications on file prior to visit.   Family History  Problem Relation Age of Onset  . Hyperlipidemia Mother   . Diabetes Father    Past Surgical History  Procedure Laterality Date  . Hernia repair    . Umbilical hernia repair    . Cesarean section      FTP    History   Social History  . Marital Status: Single    Spouse Name: N/A    Number of Children: N/A  . Years of Education: N/A   Occupational History  . Teacher/high school math-2 years   Social History Main Topics  . Smoking status: Former Research scientist (life sciences)  . Smokeless tobacco: Never Used  . Alcohol Use: No  . Drug Use: No  . Sexual Activity: No   Other Topics Concern  . Not on file   Social History Narrative   Filed Vitals:   07/11/14 0926  BP: 118/72  Pulse: 82  Temp: 98.7 F (37.1 C)  Resp: 16     Review of Systems  Genitourinary: Negative for dysuria, frequency and vaginal discharge.  Skin: Positive for rash.  no other systems positive     Objective:   Physical Exam  Constitutional: She is oriented to person, place, and time. She appears well-developed and well-nourished. No distress.  HENT:  Head: Normocephalic and atraumatic.  Eyes: Conjunctivae and EOM are normal. Pupils are equal, round, and reactive to light.  Neck:  Neck supple.  Cardiovascular: Normal rate.   Pulmonary/Chest: Effort normal.  Genitourinary:  External genitalia with mild irritation and tenderness to palpation along the left labia are without discrete lesions There is a cloudy vaginal discharge small amount No inguinal adenopathy No adnexal tenderness  Neurological: She is alert and oriented to person, place, and time. No cranial nerve deficit.  Psychiatric: She has a normal mood and affect. Her behavior is normal.  Nursing note and vitals reviewed.  Results for orders placed or performed in visit on  07/11/14  POCT Wet Prep with KOH  Result Value Ref Range   Trichomonas, UA Negative    Clue Cells Wet Prep HPF POC 1-3    Epithelial Wet Prep HPF POC 3-8    Yeast Wet Prep HPF POC neg    Bacteria Wet Prep HPF POC 1+    RBC Wet Prep HPF POC neg    WBC Wet Prep HPF POC 1-5    KOH Prep POC Negative   POCT urinalysis dipstick  Result Value Ref Range   Color, UA yellow    Clarity, UA clear    Glucose, UA neg    Bilirubin, UA neg    Ketones, UA neg    Spec Grav, UA 1.020    Blood, UA neg    pH, UA 7.0    Protein, UA neg    Urobilinogen, UA 0.2    Nitrite, UA neg    Leukocytes, UA Negative   POCT UA - Microscopic Only  Result Value Ref Range   WBC, Ur, HPF, POC 0-1    RBC, urine, microscopic neg    Bacteria, U Microscopic neg    Mucus, UA neg    Epithelial cells, urine per micros 0-4    Crystals, Ur, HPF, POC neg    Casts, Ur, LPF, POC neg    Yeast, UA neg        Assessment & Plan:  Vaginal discharge - Plan: POCT Wet Prep with KOH, POCT urinalysis dipstick, POCT UA - Microscopic Only  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 valACYclovir (VALTREX) 1000 MG tablet    Sig: Take 1 tablet (1,000 mg total) by mouth 2 (two) times daily. For 5 days then 1 daily for 1 year    Dispense:  35 tablet    Refill:  11    Refill as #30 or 31 to take 1 daily as prophylaxis  . fluconazole (DIFLUCAN) 150 MG tablet    Sig: Take 1 tablet (150 mg total) by mouth once.    Dispense:  1 tablet    Refill:  0   She declines retesting for chlamydia and gonorrhea at this point   I have participated in the care of this patient with the Advanced Practice Provider and agree with Diagnosis and Plan as documented. Chasya Zenz P. Laney Pastor, M.D.

## 2014-07-12 DIAGNOSIS — A609 Anogenital herpesviral infection, unspecified: Secondary | ICD-10-CM | POA: Insufficient documentation

## 2015-05-16 ENCOUNTER — Encounter (HOSPITAL_COMMUNITY): Payer: Self-pay | Admitting: Emergency Medicine

## 2015-05-16 ENCOUNTER — Emergency Department (HOSPITAL_COMMUNITY): Payer: BC Managed Care – PPO

## 2015-05-16 ENCOUNTER — Emergency Department (HOSPITAL_COMMUNITY)
Admission: EM | Admit: 2015-05-16 | Discharge: 2015-05-16 | Disposition: A | Discharge status: Home / Self Care | Payer: BC Managed Care – PPO | Attending: Emergency Medicine | Admitting: Emergency Medicine

## 2015-05-16 DIAGNOSIS — Z3202 Encounter for pregnancy test, result negative: Secondary | ICD-10-CM | POA: Insufficient documentation

## 2015-05-16 DIAGNOSIS — Z862 Personal history of diseases of the blood and blood-forming organs and certain disorders involving the immune mechanism: Secondary | ICD-10-CM | POA: Diagnosis not present

## 2015-05-16 DIAGNOSIS — Z8639 Personal history of other endocrine, nutritional and metabolic disease: Secondary | ICD-10-CM | POA: Diagnosis not present

## 2015-05-16 DIAGNOSIS — K5909 Other constipation: Secondary | ICD-10-CM | POA: Diagnosis not present

## 2015-05-16 DIAGNOSIS — Z87891 Personal history of nicotine dependence: Secondary | ICD-10-CM | POA: Diagnosis not present

## 2015-05-16 DIAGNOSIS — Z79899 Other long term (current) drug therapy: Secondary | ICD-10-CM | POA: Insufficient documentation

## 2015-05-16 DIAGNOSIS — R1030 Lower abdominal pain, unspecified: Secondary | ICD-10-CM | POA: Diagnosis present

## 2015-05-16 DIAGNOSIS — IMO0001 Reserved for inherently not codable concepts without codable children: Secondary | ICD-10-CM

## 2015-05-16 LAB — CBC
HEMATOCRIT: 34.1 % — AB (ref 36.0–46.0)
HEMOGLOBIN: 11.5 g/dL — AB (ref 12.0–15.0)
MCH: 28.6 pg (ref 26.0–34.0)
MCHC: 33.7 g/dL (ref 30.0–36.0)
MCV: 84.8 fL (ref 78.0–100.0)
Platelets: 230 10*3/uL (ref 150–400)
RBC: 4.02 MIL/uL (ref 3.87–5.11)
RDW: 13.5 % (ref 11.5–15.5)
WBC: 3.3 10*3/uL — ABNORMAL LOW (ref 4.0–10.5)

## 2015-05-16 LAB — COMPREHENSIVE METABOLIC PANEL
ALBUMIN: 3.6 g/dL (ref 3.5–5.0)
ALK PHOS: 56 U/L (ref 38–126)
ALT: 9 U/L — ABNORMAL LOW (ref 14–54)
ANION GAP: 7 (ref 5–15)
AST: 17 U/L (ref 15–41)
BUN: 7 mg/dL (ref 6–20)
CHLORIDE: 103 mmol/L (ref 101–111)
CO2: 26 mmol/L (ref 22–32)
Calcium: 9.4 mg/dL (ref 8.9–10.3)
Creatinine, Ser: 0.78 mg/dL (ref 0.44–1.00)
GFR calc Af Amer: >60 mL/min (ref >60)
GFR calc non Af Amer: >60 mL/min (ref >60)
GLUCOSE: 109 mg/dL — AB (ref 65–99)
POTASSIUM: 4 mmol/L (ref 3.5–5.1)
Sodium: 136 mmol/L (ref 135–145)
Total Bilirubin: 0.7 mg/dL (ref 0.3–1.2)
Total Protein: 6.9 g/dL (ref 6.5–8.1)

## 2015-05-16 LAB — URINALYSIS, ROUTINE W REFLEX MICROSCOPIC
Bilirubin Urine: NEGATIVE
Glucose, UA: NEGATIVE mg/dL
HGB URINE DIPSTICK: NEGATIVE
Ketones, ur: NEGATIVE mg/dL
LEUKOCYTES UA: NEGATIVE
Nitrite: NEGATIVE
Protein, ur: NEGATIVE mg/dL
SPECIFIC GRAVITY, URINE: 1.016 (ref 1.005–1.030)
Urobilinogen, UA: 1 mg/dL (ref 0.0–1.0)
pH: 6.5 (ref 5.0–8.0)

## 2015-05-16 LAB — I-STAT BETA HCG BLOOD, ED (MC, WL, AP ONLY)

## 2015-05-16 MED ORDER — METHOCARBAMOL 500 MG PO TABS
1000.0000 mg | ORAL_TABLET | Freq: Once | ORAL | Status: AC
  Administered 2015-05-16: 1000 mg via ORAL
  Filled 2015-05-16: qty 2

## 2015-05-16 MED ORDER — KETOROLAC TROMETHAMINE 60 MG/2ML IM SOLN
60.0000 mg | Freq: Once | INTRAMUSCULAR | Status: AC
  Administered 2015-05-16: 60 mg via INTRAMUSCULAR
  Filled 2015-05-16: qty 2

## 2015-05-16 NOTE — ED Provider Notes (Signed)
CSN: [id number]     Arrival date & time 05/16/15  0117 History   This chart was scribed for Amanda Palumbo, MD by Forrestine Him, ED Scribe. This patient was seen in room A01C/A01C and the patient's care was started 3:50 AM.   Chief Complaint  Patient presents with  . Abdominal Pain   Patient is a 29 y.o. female presenting with cramps. The history is provided by the patient. No language interpreter was used.  Abdominal Cramping This is a new problem. The current episode started 6 to 12 hours ago. The problem occurs constantly. The problem has not changed since onset.Associated symptoms include abdominal pain. Pertinent negatives include no chest pain and no shortness of breath. Nothing aggravates the symptoms. Nothing relieves the symptoms. She has tried nothing for the symptoms. The treatment provided no relief.    HPI Comments: Amanda [name] is a 29 y.o. female who presents to the Emergency Department complaining of constant, ongoing  lower abdominal pain and pelvic pain that radiates to the lower back onset this morning. Associated nausea. No aggravating or alleviating factors at this time. No OTC medications or home remedies attempted prior to arrival. Denies any fever, chills, vomiting, diarrhea, or shortness of breath. No vaginal discharge, dysuria, vaginal bleeding, urinary frequency, or urinary urgency. 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 No known allergies to medications.  Past Medical History  Diagnosis Date  . Hypoglycemia   . Anemia    Past Surgical History  Procedure Laterality Date  . Hernia repair    . Umbilical hernia repair    . Cesarean section      FTP   Family History  Problem Relation Age of Onset  . Hyperlipidemia Mother   . Diabetes Father    Social History  Substance Use Topics  . Smoking status: Former Research scientist (life sciences)  . Smokeless tobacco: Never Used  . Alcohol Use: No   OB History    Gravida Para Term Preterm AB TAB SAB Ectopic  Multiple Living   4 1 1  2 1 1   1      Review of Systems  Constitutional: Negative for fever and chills.  Respiratory: Negative for cough and shortness of breath.   Cardiovascular: Negative for chest pain.  Gastrointestinal: Positive for nausea and abdominal pain. Negative for vomiting.  Genitourinary: Positive for pelvic pain.  Musculoskeletal: Positive for back pain.  Skin: Negative for rash.  Psychiatric/Behavioral: Negative for confusion.  All other systems reviewed and are negative.     Allergies  Review of patient's allergies indicates no known allergies.  Home Medications   Prior to Admission medications   Medication Sig Start Date End Date Taking? Authorizing Provider  fluconazole (DIFLUCAN) 150 MG tablet Take 1 tablet (150 mg total) by mouth once. 07/11/14   Leandrew Koyanagi, MD  metroNIDAZOLE (FLAGYL) 500 MG tablet Take 500 mg by mouth 2 (two) times daily. 01/02/14   Historical Provider, MD  mupirocin ointment (BACTROBAN) 2 % Apply 1 application topically 2 (two) times daily.    Historical Provider, MD  oxyCODONE-acetaminophen (PERCOCET/ROXICET) 5-325 MG per tablet Take 1 tablet by mouth every 4 (four) hours as needed. 01/14/14   Deirdre Freida Busman, CNM  valACYclovir (VALTREX) 1000 MG tablet Take 1 tablet (1,000 mg total) by mouth 2 (two) times daily. For 5 days then 1 daily for 1 year 07/11/14   Leandrew Koyanagi, MD   Triage Vitals: BP 105/61 mmHg  Pulse 89  Temp(Src)  98.2 F (36.8 C) (Oral)  Resp 20  SpO2 99%  LMP 05/07/2015   Physical Exam  Constitutional: She is oriented to person, place, and time. She appears well-developed and well-nourished. No distress.  HENT:  Head: Normocephalic and atraumatic.  Mouth/Throat: Oropharynx is clear and moist. No oropharyngeal exudate.  Eyes: EOM are normal. Pupils are equal, round, and reactive to light.  Neck: Normal range of motion.  Cardiovascular: Normal rate, regular rhythm and normal heart sounds.   Pulmonary/Chest:  Effort normal and breath sounds normal. No respiratory distress. She has no wheezes. She has no rales.  Abdominal: Soft. She exhibits no distension. There is no tenderness. There is no rigidity, no rebound, no guarding, no tenderness at McBurney's point and negative Murphy's sign.  Hyperactive bowel sounds throughout Stool palpable  Musculoskeletal: Normal range of motion. She exhibits no tenderness.  Muscle spasm to R paraspinal muscle   Neurological: She is alert and oriented to person, place, and time. She has normal reflexes.  Skin: Skin is warm and dry.  Psychiatric: She has a normal mood and affect. Judgment normal.  Nursing note and vitals reviewed.   ED Course  Procedures (including critical care time)  DIAGNOSTIC STUDIES: Oxygen Saturation is 99% on RA, Normal by my interpretation.    COORDINATION OF CARE: 3:58 AM- Will order i-stat beta hCG blood, CMP, CBC, and urinalysis. Discussed treatment plan with pt at bedside and pt agreed to plan.     Labs Review Labs Reviewed  COMPREHENSIVE METABOLIC PANEL - Abnormal; Notable for the following:    Glucose, Bld 109 (*)    ALT 9 (*)    All other components within normal limits  CBC - Abnormal; Notable for the following:    WBC 3.3 (*)    Hemoglobin 11.5 (*)    HCT 34.1 (*)    All other components within normal limits  URINALYSIS, ROUTINE W REFLEX MICROSCOPIC (NOT AT [facility])  I-STAT BETA HCG BLOOD, ED ([facility], [facility], [facility] ONLY)    Imaging Review No results found. I have personally reviewed and evaluated these images and lab results as part of my medical decision-making.   EKG Interpretation None      MDM   Final diagnoses:  None    Will treat for gas and constipation.  Start miralax QAM.  Increase water and fiber in your diet.  Strict abdominal pain return precautions given  I personally performed the services described in this documentation, which was scribed in my presence. The recorded information has been reviewed and is  accurate.    Veatrice Kells, MD 05/16/15 [phone]

## 2015-05-16 NOTE — ED Notes (Signed)
Pt reports R lower abdominal/pelvic pain radiating to lower back, now starting to feel pain on L side too. C/o nausea. No vaginal discharge/bleeding no urinary sx.

## 2015-06-09 ENCOUNTER — Ambulatory Visit (INDEPENDENT_AMBULATORY_CARE_PROVIDER_SITE_OTHER): Payer: BC Managed Care – PPO | Admitting: Physician Assistant

## 2015-06-09 VITALS — BP 108/64 | HR 82 | Temp 98.3°F | Resp 16 | Ht 62.5 in | Wt 162.6 lb

## 2015-06-09 DIAGNOSIS — H5711 Ocular pain, right eye: Secondary | ICD-10-CM | POA: Diagnosis not present

## 2015-06-09 MED ORDER — POLYMYXIN B-TRIMETHOPRIM 10000-0.1 UNIT/ML-% OP SOLN: 1.0000 [drp] | OPHTHALMIC | Status: AC

## 2015-06-09 NOTE — Patient Instructions (Signed)
Apply drops in right eye every 4 hours while awake for 7 days. Ibuprofen/tylenol for pain. Warm compresses. Return if you develop worsening pain, change in vision, fever or chills.

## 2015-06-09 NOTE — Progress Notes (Signed)
[facility] [address], [place] [address] [phone]  Date:  06/09/2015   Name:  Amanda [name]   DOB:  03-01-1986   MRN:  [id number]  PCP:  No PCP Per Patient    Chief Complaint: Eye Problem   History of Present Illness:  This is a 29 y.o. female who is presenting with right eye pain x 4 days. Symptoms worsened yesterday. She states her lower lid hurts to touch. Blinking causes the lower lid to hurt. She wakes in the morning with some crusting in the corner of her eye. No discharge from the eye otherwise and no significant eye redness. She is also noting some pain behind her eye. No change in her vision. She wear glasses only. She has seen an optometrist in the past but never had a dilated eye exam. She denies uri sx, fever, chills, n/v. She has not tried anything for her symptoms. 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  Review of Systems:  Review of Systems See HPI  Patient Active Problem List   Diagnosis Date Noted  . HSV (herpes simplex virus) anogenital infection 07/12/2014    Prior to Admission medications   Medication Sig Start Date End Date Taking? Authorizing Provider  valACYclovir (VALTREX) 1000 MG tablet Take 1 tablet (1,000 mg total) by mouth 2 (two) times daily. For 5 days then 1 daily for 1 year Patient taking differently: Take 1,000 mg by mouth daily.  07/11/14  Yes Leandrew Koyanagi, MD    No Known Allergies  Past Surgical History  Procedure Laterality Date  . Hernia repair    . Umbilical hernia repair    . Cesarean section      FTP    Social History  Substance Use Topics  . Smoking status: Former Research scientist (life sciences)  . Smokeless tobacco: Never Used  . Alcohol Use: No    Family History  Problem Relation Age of Onset  . Hyperlipidemia Mother   . Diabetes Father     Medication list has been reviewed and updated.  Physical Examination:  Physical Exam  Constitutional: She is oriented to person, place, and time. She  appears well-developed and well-nourished. No distress.  HENT:  Head: Normocephalic and atraumatic.  Right Ear: Hearing normal.  Left Ear: Hearing normal.  Nose: Nose normal.  Mouth/Throat: Uvula is midline, oropharynx is clear and moist and mucous membranes are normal.  Eyes: EOM are normal. Pupils are equal, round, and reactive to light. No scleral icterus.  Right eye with mild crusting in medial corner Pain with palpation of lower lid and over nasolacrimal gland. Right eye conjunctiva with mild erythema EOM without pain Eyelids appear normal Woods lamp without uptake   Pulmonary/Chest: Effort normal. No respiratory distress.  Musculoskeletal: Normal range of motion.  Lymphadenopathy:       Head (right side): No submental, no submandibular and no tonsillar adenopathy present.       Head (left side): No submental, no submandibular and no tonsillar adenopathy present.    She has no cervical adenopathy.  Neurological: She is alert and oriented to person, place, and time.  Skin: Skin is warm, dry and intact. No lesion and no rash noted.  Psychiatric: She has a normal mood and affect. Her speech is normal and behavior is normal. Thought content normal.   BP 108/64 mmHg  Pulse 82  Temp(Src) 98.3 F (36.8 C) (Oral)  Resp 16  Ht 5' 2.5" (1.588 m)  Wt 162 lb  9.6 oz (73.755 kg)  BMI 29.25 kg/m2  SpO2 98%  LMP 06/02/2015   Visual Acuity Screening   Right eye Left eye Both eyes  Without correction:     With correction: 20/20 20/20 20/15    Assessment and Plan:  1. Eye pain, right Eye exam essentially normal by visual inspection except for ttp over lower lid. Woods lamp without uptake. Will start on polytrim for possible conjunctivitis (she does have mild crusting at medial corner) although this is not a classic picture. Her vision is normal which is reassuring but the pain behind her eye warrants further evaluation. I have referred to ophtho for further eval. -  trimethoprim-polymyxin b (POLYTRIM) ophthalmic solution; Place 1 drop into the left eye every 4 (four) hours.  Dispense: 10 mL; Refill: 0 - Ambulatory referral to Ophthalmology   Benjaman Pott. Drenda Freeze, MHS [facility] [place] [facility]  06/09/2015

## 2015-07-20 ENCOUNTER — Encounter: Payer: Self-pay | Admitting: Physician Assistant

## 2015-07-20 DIAGNOSIS — H1010 Acute atopic conjunctivitis, unspecified eye: Secondary | ICD-10-CM | POA: Insufficient documentation

## 2015-08-04 ENCOUNTER — Other Ambulatory Visit: Payer: Self-pay

## 2015-08-04 DIAGNOSIS — Z3490 Encounter for supervision of normal pregnancy, unspecified, unspecified trimester: Secondary | ICD-10-CM

## 2015-08-04 NOTE — Progress Notes (Signed)
Pt has hx of tubal pregnancy [place] scheduled for December 14th @ 0800. OB appt scheduled after [place].   Pt agreed.

## 2015-08-05 ENCOUNTER — Encounter: Payer: Self-pay | Admitting: *Deleted

## 2015-08-10 ENCOUNTER — Other Ambulatory Visit: Payer: Self-pay | Admitting: Internal Medicine

## 2015-08-11 ENCOUNTER — Other Ambulatory Visit: Payer: Self-pay | Admitting: Obstetrics and Gynecology

## 2015-08-11 ENCOUNTER — Ambulatory Visit (HOSPITAL_COMMUNITY)
Admission: RE | Admit: 2015-08-11 | Discharge: 2015-08-11 | Disposition: A | Discharge status: Home / Self Care | Payer: BC Managed Care – PPO | Source: Ambulatory Visit | Attending: Obstetrics and Gynecology | Admitting: Obstetrics and Gynecology

## 2015-08-11 DIAGNOSIS — O3411 Maternal care for benign tumor of corpus uteri, first trimester: Secondary | ICD-10-CM | POA: Diagnosis not present

## 2015-08-11 DIAGNOSIS — Z36 Encounter for antenatal screening of mother: Secondary | ICD-10-CM | POA: Insufficient documentation

## 2015-08-11 DIAGNOSIS — Z3490 Encounter for supervision of normal pregnancy, unspecified, unspecified trimester: Secondary | ICD-10-CM

## 2015-08-11 DIAGNOSIS — D259 Leiomyoma of uterus, unspecified: Secondary | ICD-10-CM | POA: Insufficient documentation

## 2015-08-11 DIAGNOSIS — N8311 Corpus luteum cyst of right ovary: Secondary | ICD-10-CM | POA: Diagnosis not present

## 2015-08-11 DIAGNOSIS — Z3A01 Less than 8 weeks gestation of pregnancy: Secondary | ICD-10-CM | POA: Diagnosis not present

## 2015-08-11 DIAGNOSIS — O3481 Maternal care for other abnormalities of pelvic organs, first trimester: Secondary | ICD-10-CM | POA: Diagnosis not present

## 2015-08-12 ENCOUNTER — Telehealth: Payer: Self-pay | Admitting: General Practice

## 2015-08-12 NOTE — Telephone Encounter (Signed)
Per Dr Elly Modena, ultrasound shows IUP with EDD of 03/31/16. Patient should take PNV and start care. Called and informed patient of results, dating & recommendations. Patient verbalized understanding & states she has an appt here 1/24 and asked if that was too late. Told patient that is fine and we will see her then. Patient verbalized understanding & had no questions

## 2015-08-29 NOTE — L&D Delivery Note (Addendum)
Delivery Note At 4:10 AM a viable female was delivered via VBAC, Spontaneously (Presentation: OA ).  APGAR: 8, Placenta status: delivered spontaneously intact Cord:  without complications .    Anesthesia:  Fentanyl & lidocaine for repair Episiotomy: None Lacerations: 3rd degree, both external & sphincter, less than 50% torn. Suture Repair: 3.0, vicryl Est. Blood Loss (mL):  300  Mom to postpartum.  Baby to Couplet care / Skin to Skin. All instruments and gauze accounted for and sharps disposed of.  Casimer Leek, MD, PGY-1, MPH 03/28/2016, 4:54 AM  OB FELLOW DELIVERY ATTESTATION  I was gloved and present for the delivery in its entirety, and I agree with the above resident's note.    [name], MD 5:02 AM

## 2015-09-21 ENCOUNTER — Ambulatory Visit (INDEPENDENT_AMBULATORY_CARE_PROVIDER_SITE_OTHER): Payer: BC Managed Care – PPO | Admitting: Advanced Practice Midwife

## 2015-09-21 ENCOUNTER — Encounter: Payer: Self-pay | Admitting: Advanced Practice Midwife

## 2015-09-21 VITALS — BP 100/68 | HR 92 | Temp 99.0°F | Ht 64.75 in | Wt 160.9 lb

## 2015-09-21 DIAGNOSIS — Z124 Encounter for screening for malignant neoplasm of cervix: Secondary | ICD-10-CM

## 2015-09-21 DIAGNOSIS — Z98891 History of uterine scar from previous surgery: Secondary | ICD-10-CM

## 2015-09-21 DIAGNOSIS — Z3491 Encounter for supervision of normal pregnancy, unspecified, first trimester: Secondary | ICD-10-CM | POA: Diagnosis not present

## 2015-09-21 DIAGNOSIS — Z113 Encounter for screening for infections with a predominantly sexual mode of transmission: Secondary | ICD-10-CM | POA: Diagnosis not present

## 2015-09-21 DIAGNOSIS — O34219 Maternal care for unspecified type scar from previous cesarean delivery: Secondary | ICD-10-CM | POA: Insufficient documentation

## 2015-09-21 LAB — POCT URINALYSIS DIP (DEVICE)
BILIRUBIN URINE: NEGATIVE
GLUCOSE, UA: NEGATIVE mg/dL
HGB URINE DIPSTICK: NEGATIVE
Ketones, ur: NEGATIVE mg/dL
LEUKOCYTES UA: NEGATIVE
Nitrite: NEGATIVE
Protein, ur: NEGATIVE mg/dL
SPECIFIC GRAVITY, URINE: 1.02 (ref 1.005–1.030)
Urobilinogen, UA: 1 mg/dL (ref 0.0–1.0)
pH: 6 (ref 5.0–8.0)

## 2015-09-21 NOTE — Progress Notes (Signed)
Here for initial pregnancy visit. Given new patient education packet. Declines flu shot.

## 2015-09-21 NOTE — Progress Notes (Signed)
New OB     See Smart Set  Subjective:    Amanda [name] is a [id number] [redacted]w[redacted]d being seen today for her first obstetrical visit.  Her obstetrical history is significant for Previous C/Section, desires TOLAC. Patient does intend to breast feed. Pregnancy history fully reviewed.  Patient reports no complaints.  Filed Vitals:   09/21/15 0932 09/21/15 0933  BP: 100/68   Pulse: 92   Temp: 99 F (37.2 C)   Height:  5' 4.75" (1.645 m)  Weight: 160 lb 14.4 oz (72.984 kg)     HISTORY: OB History  Gravida Para Term Preterm AB SAB TAB Ectopic Multiple Living  5 1 1  3 1 1 1  1     # Outcome Date GA Lbr Len/2nd Weight Sex Delivery Anes PTL Lv  5 Current           4 Ectopic 2015          3 SAB 2010 [redacted]w[redacted]d            Comments: had D&c  2 TAB 2007          1 Term 2005 [redacted]w[redacted]d  7 lb 8 oz (3.402 kg) F CS-LTranv   Y     Comments: C/S because pelvis small, and unsure how long PROM     Past Medical History  Diagnosis Date  . Hypoglycemia   . Anemia   . Sickle cell trait [facility])    Past Surgical History  Procedure Laterality Date  . Hernia repair    . Umbilical hernia repair    . Cesarean section      FTP  . Dilation and curettage of uterus     Family History  Problem Relation Age of Onset  . Hyperlipidemia Mother   . Diabetes Father   . Hepatitis C Father   . Sickle cell anemia Father      Exam    Uterus:  Fundal Height: 12 cm  Pelvic Exam:    Perineum: No Hemorrhoids, Normal Perineum   Vulva: Bartholin's, Urethra, Skene's normal   Vagina:  normal mucosa, normal discharge   pH:    Cervix: nulliparous appearance   Adnexa: normal adnexa and no mass, fullness, tenderness   Bony Pelvis: gynecoid  System: Breast:  normal appearance, no masses or tenderness   Skin: normal coloration and turgor, no rashes    Neurologic: oriented, grossly non-focal   Extremities: normal strength, tone, and muscle mass   HEENT neck supple with midline trachea   Mouth/Teeth mucous membranes moist,  pharynx normal without lesions   Neck supple and no masses   Cardiovascular: regular rate and rhythm, no murmurs or gallops   Respiratory:  appears well, vitals normal, no respiratory distress, acyanotic, normal RR, ear and throat exam is normal, neck free of mass or lymphadenopathy, chest clear, no wheezing, crepitations, rhonchi, normal symmetric air entry   Abdomen: soft, non-tender; bowel sounds normal; no masses,  no organomegaly   Urinary: urethral meatus normal      Assessment:    Pregnancy: [id number] Patient Active Problem List   Diagnosis Date Noted  . History of cesarean delivery 09/21/2015  . Allergic conjunctivitis 07/20/2015  . HSV (herpes simplex virus) anogenital infection 07/12/2014        Plan:     Initial labs drawn. Prenatal vitamins. Problem list reviewed and updated. Genetic Screening discussed First Screen: ordered.  Ultrasound discussed; fetal survey: requested.  Follow up in 4 weeks. 50% of 30 min visit spent  on counseling and coordination of care.     [facility] 09/21/2015

## 2015-09-21 NOTE — Patient Instructions (Signed)
First Trimester of Pregnancy The first trimester of pregnancy is from week 1 until the end of week 12 (months 1 through 3). A week after a sperm fertilizes an egg, the egg will implant on the wall of the uterus. This embryo will begin to develop into a baby. Genes from you and your partner are forming the baby. The female genes determine whether the baby is a boy or a girl. At 6-8 weeks, the eyes and face are formed, and the heartbeat can be seen on ultrasound. At the end of 12 weeks, all the baby's organs are formed.  Now that you are pregnant, you will want to do everything you can to have a healthy baby. Two of the most important things are to get good prenatal care and to follow your health care provider's instructions. Prenatal care is all the medical care you receive before the baby's birth. This care will help prevent, find, and treat any problems during the pregnancy and childbirth. BODY CHANGES Your body goes through many changes during pregnancy. The changes vary from woman to woman.   You may gain or lose a couple of pounds at first.  You may feel sick to your stomach (nauseous) and throw up (vomit). If the vomiting is uncontrollable, call your health care provider.  You may tire easily.  You may develop headaches that can be relieved by medicines approved by your health care provider.  You may urinate more often. Painful urination may mean you have a bladder infection.  You may develop heartburn as a result of your pregnancy.  You may develop constipation because certain hormones are causing the muscles that push waste through your intestines to slow down.  You may develop hemorrhoids or swollen, bulging veins (varicose veins).  Your breasts may begin to grow larger and become tender. Your nipples may stick out more, and the tissue that surrounds them (areola) may become darker.  Your gums may bleed and may be sensitive to brushing and flossing.  Dark spots or blotches (chloasma,  mask of pregnancy) may develop on your face. This will likely fade after the baby is born.  Your menstrual periods will stop.  You may have a loss of appetite.  You may develop cravings for certain kinds of food.  You may have changes in your emotions from day to day, such as being excited to be pregnant or being concerned that something may go wrong with the pregnancy and baby.  You may have more vivid and strange dreams.  You may have changes in your hair. These can include thickening of your hair, rapid growth, and changes in texture. Some women also have hair loss during or after pregnancy, or hair that feels dry or thin. Your hair will most likely return to normal after your baby is born. WHAT TO EXPECT AT YOUR PRENATAL VISITS During a routine prenatal visit:  You will be weighed to make sure you and the baby are growing normally.  Your blood pressure will be taken.  Your abdomen will be measured to track your baby's growth.  The fetal heartbeat will be listened to starting around week 10 or 12 of your pregnancy.  Test results from any previous visits will be discussed. Your health care provider may ask you:  How you are feeling.  If you are feeling the baby move.  If you have had any abnormal symptoms, such as leaking fluid, bleeding, severe headaches, or abdominal cramping.  If you are using any tobacco products,   including cigarettes, chewing tobacco, and electronic cigarettes.  If you have any questions. Other tests that may be performed during your first trimester include:  Blood tests to find your blood type and to check for the presence of any previous infections. They will also be used to check for low iron levels (anemia) and Rh antibodies. Later in the pregnancy, blood tests for diabetes will be done along with other tests if problems develop.  Urine tests to check for infections, diabetes, or protein in the urine.  An ultrasound to confirm the proper growth  and development of the baby.  An amniocentesis to check for possible genetic problems.  Fetal screens for spina bifida and Down syndrome.  You may need other tests to make sure you and the baby are doing well.  HIV (human immunodeficiency virus) testing. Routine prenatal testing includes screening for HIV, unless you choose not to have this test. HOME CARE INSTRUCTIONS  Medicines  Follow your health care provider's instructions regarding medicine use. Specific medicines may be either safe or unsafe to take during pregnancy.  Take your prenatal vitamins as directed.  If you develop constipation, try taking a stool softener if your health care provider approves. Diet  Eat regular, well-balanced meals. Choose a variety of foods, such as meat or vegetable-based protein, fish, milk and low-fat dairy products, vegetables, fruits, and whole grain breads and cereals. Your health care provider will help you determine the amount of weight gain that is right for you.  Avoid raw meat and uncooked cheese. These carry germs that can cause birth defects in the baby.  Eating four or five small meals rather than three large meals a day may help relieve nausea and vomiting. If you start to feel nauseous, eating a few soda crackers can be helpful. Drinking liquids between meals instead of during meals also seems to help nausea and vomiting.  If you develop constipation, eat more high-fiber foods, such as fresh vegetables or fruit and whole grains. Drink enough fluids to keep your urine clear or pale yellow. Activity and Exercise  Exercise only as directed by your health care provider. Exercising will help you:  Control your weight.  Stay in shape.  Be prepared for labor and delivery.  Experiencing pain or cramping in the lower abdomen or low back is a good sign that you should stop exercising. Check with your health care provider before continuing normal exercises.  Try to avoid standing for long  periods of time. Move your legs often if you must stand in one place for a long time.  Avoid heavy lifting.  Wear low-heeled shoes, and practice good posture.  You may continue to have sex unless your health care provider directs you otherwise. Relief of Pain or Discomfort  Wear a good support bra for breast tenderness.   Take warm sitz baths to soothe any pain or discomfort caused by hemorrhoids. Use hemorrhoid cream if your health care provider approves.   Rest with your legs elevated if you have leg cramps or low back pain.  If you develop varicose veins in your legs, wear support hose. Elevate your feet for 15 minutes, 3-4 times a day. Limit salt in your diet. Prenatal Care  Schedule your prenatal visits by the twelfth week of pregnancy. They are usually scheduled monthly at first, then more often in the last 2 months before delivery.  Write down your questions. Take them to your prenatal visits.  Keep all your prenatal visits as directed by your   health care provider. Safety  Wear your seat belt at all times when driving.  Make a list of emergency phone numbers, including numbers for family, friends, the hospital, and police and fire departments. General Tips  Ask your health care provider for a referral to a local prenatal education class. Begin classes no later than at the beginning of month 6 of your pregnancy.  Ask for help if you have counseling or nutritional needs during pregnancy. Your health care provider can offer advice or refer you to specialists for help with various needs.  Do not use hot tubs, steam rooms, or saunas.  Do not douche or use tampons or scented sanitary pads.  Do not cross your legs for long periods of time.  Avoid cat litter boxes and soil used by cats. These carry germs that can cause birth defects in the baby and possibly loss of the fetus by miscarriage or stillbirth.  Avoid all smoking, herbs, alcohol, and medicines not prescribed by  your health care provider. Chemicals in these affect the formation and growth of the baby.  Do not use any tobacco products, including cigarettes, chewing tobacco, and electronic cigarettes. If you need help quitting, ask your health care provider. You may receive counseling support and other resources to help you quit.  Schedule a dentist appointment. At home, brush your teeth with a soft toothbrush and be gentle when you floss. SEEK MEDICAL CARE IF:   You have dizziness.  You have mild pelvic cramps, pelvic pressure, or nagging pain in the abdominal area.  You have persistent nausea, vomiting, or diarrhea.  You have a bad smelling vaginal discharge.  You have pain with urination.  You notice increased swelling in your face, hands, legs, or ankles. SEEK IMMEDIATE MEDICAL CARE IF:   You have a fever.  You are leaking fluid from your vagina.  You have spotting or bleeding from your vagina.  You have severe abdominal cramping or pain.  You have rapid weight gain or loss.  You vomit blood or material that looks like coffee grounds.  You are exposed to German measles and have never had them.  You are exposed to fifth disease or chickenpox.  You develop a severe headache.  You have shortness of breath.  You have any kind of trauma, such as from a fall or a car accident.   This information is not intended to replace advice given to you by your health care provider. Make sure you discuss any questions you have with your health care provider.   Document Released: 08/08/2001 Document Revised: 09/04/2014 Document Reviewed: 06/24/2013 Elsevier Interactive Patient Education 2016 Elsevier Inc.  

## 2015-09-22 LAB — PRENATAL PROFILE (SOLSTAS)
Antibody Screen: NEGATIVE
BASOS ABS: 0 10*3/uL (ref 0.0–0.1)
BASOS PCT: 0 % (ref 0–1)
Eosinophils Absolute: 0.1 10*3/uL (ref 0.0–0.7)
Eosinophils Relative: 2 % (ref 0–5)
HCT: 33.4 % — ABNORMAL LOW (ref 36.0–46.0)
HEP B S AG: NEGATIVE
HIV 1&2 Ab, 4th Generation: NONREACTIVE
Hemoglobin: 11.5 g/dL — ABNORMAL LOW (ref 12.0–15.0)
LYMPHS ABS: 0.8 10*3/uL (ref 0.7–4.0)
Lymphocytes Relative: 18 % (ref 12–46)
MCH: 28.3 pg (ref 26.0–34.0)
MCHC: 34.4 g/dL (ref 30.0–36.0)
MCV: 82.3 fL (ref 78.0–100.0)
MONOS PCT: 8 % (ref 3–12)
MPV: 9.3 fL (ref 8.6–12.4)
Monocytes Absolute: 0.4 10*3/uL (ref 0.1–1.0)
NEUTROS ABS: 3.2 10*3/uL (ref 1.7–7.7)
NEUTROS PCT: 72 % (ref 43–77)
PLATELETS: 227 10*3/uL (ref 150–400)
RBC: 4.06 MIL/uL (ref 3.87–5.11)
RDW: 13.4 % (ref 11.5–15.5)
RH TYPE: POSITIVE
RUBELLA: 2.12 {index} — AB (ref <0.90)
WBC: 4.4 10*3/uL (ref 4.0–10.5)

## 2015-09-22 LAB — PRESCRIPTION MONITORING PROFILE (19 PANEL)
Amphetamine/Meth: NEGATIVE ng/mL
BARBITURATE SCREEN, URINE: NEGATIVE ng/mL
BENZODIAZEPINE SCREEN, URINE: NEGATIVE ng/mL
BUPRENORPHINE, URINE: NEGATIVE ng/mL
CREATININE, URINE: 143.79 mg/dL (ref >20.0)
Cannabinoid Scrn, Ur: NEGATIVE ng/mL
Carisoprodol, Urine: NEGATIVE ng/mL
Cocaine Metabolites: NEGATIVE ng/mL
Fentanyl, Ur: NEGATIVE ng/mL
MDMA URINE: NEGATIVE ng/mL
METHAQUALONE SCREEN (URINE): NEGATIVE ng/mL
Meperidine, Ur: NEGATIVE ng/mL
Methadone Screen, Urine: NEGATIVE ng/mL
NITRITES URINE, INITIAL: NEGATIVE ug/mL
OPIATE SCREEN, URINE: NEGATIVE ng/mL
Oxycodone Screen, Ur: NEGATIVE ng/mL
PH URINE, INITIAL: 8.1 pH (ref 4.5–8.9)
PROPOXYPHENE: NEGATIVE ng/mL
Phencyclidine, Ur: NEGATIVE ng/mL
TRAMADOL UR: NEGATIVE ng/mL
Tapentadol, urine: NEGATIVE ng/mL
ZOLPIDEM, URINE: NEGATIVE ng/mL

## 2015-09-22 LAB — GC/CHLAMYDIA PROBE AMP (~~LOC~~) NOT AT ARMC
Chlamydia: NEGATIVE
Neisseria Gonorrhea: NEGATIVE

## 2015-09-22 LAB — CYTOLOGY - PAP

## 2015-09-23 LAB — CULTURE, OB URINE

## 2015-09-24 LAB — HEMOGLOBINOPATHY EVALUATION
HGB F QUANT: 0.5 % (ref 0.0–2.0)
Hemoglobin Other: 0 %
Hgb A2 Quant: 3.1 % (ref 2.2–3.2)
Hgb A: 56.9 % — ABNORMAL LOW (ref 96.8–97.8)
Hgb S Quant: 39.5 % — ABNORMAL HIGH

## 2015-09-26 ENCOUNTER — Encounter: Payer: Self-pay | Admitting: Advanced Practice Midwife

## 2015-09-26 DIAGNOSIS — D573 Sickle-cell trait: Secondary | ICD-10-CM | POA: Insufficient documentation

## 2015-09-27 ENCOUNTER — Telehealth: Payer: Self-pay

## 2015-09-27 NOTE — Telephone Encounter (Signed)
Per Lelan Pons, CNM pt's FOB needs to be tested for sickle cell.  Called pt and LM to return call to the Clinics.

## 2015-09-28 ENCOUNTER — Inpatient Hospital Stay (HOSPITAL_COMMUNITY): Admission: RE | Admit: 2015-09-28 | Payer: BC Managed Care – PPO | Source: Ambulatory Visit

## 2015-09-28 ENCOUNTER — Ambulatory Visit (HOSPITAL_COMMUNITY): Admission: RE | Admit: 2015-09-28 | Payer: BC Managed Care – PPO | Source: Ambulatory Visit

## 2015-09-28 NOTE — Telephone Encounter (Signed)
Called pt and left message on her personal voice mail stating that I am calling with non-urgent test results. Please call back and state whether a detailed message can be left on her voice mail. Pt has +sickle cell trait and her FOB needs to be tested for sickle cell.

## 2015-09-29 NOTE — Telephone Encounter (Signed)
Called patient, no answer- unable to leave message due to voicemail full.

## 2015-10-04 NOTE — Telephone Encounter (Signed)
Called pt and left message on her personal voice mail that the father of her baby needs to be tested for sickle cell because her test is positive for the sickle cell trait. She does not have sickle cell disease, just the trait. This information from the father of her baby will help by knowing what the risk is for the baby. Please call back and we can explain further.

## 2015-10-05 ENCOUNTER — Encounter: Payer: Self-pay | Admitting: General Practice

## 2015-10-05 NOTE — Telephone Encounter (Signed)
Called patient, no answer- left message stating we are trying to reach you with results, please call us back at the clinics. Will send letter 

## 2015-10-19 ENCOUNTER — Ambulatory Visit (INDEPENDENT_AMBULATORY_CARE_PROVIDER_SITE_OTHER): Payer: BC Managed Care – PPO | Admitting: Family

## 2015-10-19 VITALS — BP 98/66 | HR 92 | Temp 98.4°F | Wt 166.2 lb

## 2015-10-19 DIAGNOSIS — A609 Anogenital herpesviral infection, unspecified: Secondary | ICD-10-CM

## 2015-10-19 DIAGNOSIS — O26892 Other specified pregnancy related conditions, second trimester: Secondary | ICD-10-CM

## 2015-10-19 DIAGNOSIS — O98312 Other infections with a predominantly sexual mode of transmission complicating pregnancy, second trimester: Secondary | ICD-10-CM

## 2015-10-19 DIAGNOSIS — O0993 Supervision of high risk pregnancy, unspecified, third trimester: Secondary | ICD-10-CM | POA: Insufficient documentation

## 2015-10-19 DIAGNOSIS — Z3482 Encounter for supervision of other normal pregnancy, second trimester: Secondary | ICD-10-CM | POA: Diagnosis not present

## 2015-10-19 DIAGNOSIS — A6009 Herpesviral infection of other urogenital tract: Secondary | ICD-10-CM

## 2015-10-19 DIAGNOSIS — D573 Sickle-cell trait: Secondary | ICD-10-CM

## 2015-10-19 DIAGNOSIS — Z3492 Encounter for supervision of normal pregnancy, unspecified, second trimester: Secondary | ICD-10-CM

## 2015-10-19 DIAGNOSIS — O98812 Other maternal infectious and parasitic diseases complicating pregnancy, second trimester: Secondary | ICD-10-CM

## 2015-10-19 DIAGNOSIS — N898 Other specified noninflammatory disorders of vagina: Secondary | ICD-10-CM

## 2015-10-19 DIAGNOSIS — Z98891 History of uterine scar from previous surgery: Secondary | ICD-10-CM

## 2015-10-19 DIAGNOSIS — B373 Candidiasis of vulva and vagina: Secondary | ICD-10-CM

## 2015-10-19 LAB — POCT URINALYSIS DIP (DEVICE)
Bilirubin Urine: NEGATIVE
GLUCOSE, UA: NEGATIVE mg/dL
Hgb urine dipstick: NEGATIVE
Ketones, ur: NEGATIVE mg/dL
NITRITE: NEGATIVE
PROTEIN: NEGATIVE mg/dL
SPECIFIC GRAVITY, URINE: 1.015 (ref 1.005–1.030)
UROBILINOGEN UA: 0.2 mg/dL (ref 0.0–1.0)
pH: 8.5 — ABNORMAL HIGH (ref 5.0–8.0)

## 2015-10-19 MED ORDER — TERCONAZOLE 0.4 % VA CREA: 1.0000 | TOPICAL_CREAM | Freq: Every day | VAGINAL | Status: DC

## 2015-10-19 NOTE — Progress Notes (Signed)
Pt thinks she has a yeast infection - white d/c.  Pt states she was told @ last visit that she would be taking valtrex but prescription was not sent to her pharmacy.

## 2015-10-20 LAB — AFP, QUAD SCREEN
AFP: 46.4 ng/mL
CURR GEST AGE: 16 wks.days
HCG TOTAL: 24.36 [IU]/mL
INH: 89.5 pg/mL
Interpretation-AFP: NEGATIVE
MOM FOR AFP: 1.27
MOM FOR HCG: 0.59
MoM for INH: 0.53
OPEN SPINA BIFIDA: NEGATIVE
Osb Risk: 1:11200 {titer}
Tri 18 Scr Risk Est: NEGATIVE
UE3 MOM: 1.35
UE3 VALUE: 1.1 ng/mL

## 2015-10-20 NOTE — Progress Notes (Signed)
Subjective:  Amanda [name] is a 30 y.o. G5P1031 at [redacted]w[redacted]d being seen today for ongoing prenatal care.  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  Patient reports vaginal irritation.  Contractions: Not present. Vag. Bleeding: None.  Movement: Present. Denies leaking of fluid.   The following portions of the patient's history were reviewed and updated as appropriate: allergies, current medications, past family history, past medical history, past social history, past surgical history and problem list. Problem list updated.  Objective:   Filed Vitals:   10/19/15 0908  BP: 98/66  Pulse: 92  Temp: 98.4 F (36.9 C)  Weight: 166 lb 3.2 oz (75.388 kg)    Fetal Status: Fetal Heart Rate (bpm): 145 Fundal Height: 18 cm Movement: Present     General:  Alert, oriented and cooperative. Patient is in no acute distress.  Skin: Skin is warm and dry. No rash noted.   Cardiovascular: Normal heart rate noted  Respiratory: Normal respiratory effort, no problems with respiration noted  Abdomen: Soft, gravid, appropriate for gestational age. Pain/Pressure: Absent     Pelvic: Vag. Bleeding: None Vag D/C Character: White  Thick, white discharge Cervical exam deferred        Extremities: Normal range of motion.  Edema: None  Mental Status: Normal mood and affect. Normal behavior. Normal judgment and thought content.   Urinalysis: Urine Protein: Negative Urine Glucose: Negative  Assessment and Plan:  Pregnancy: [id number] at [redacted]w[redacted]d  1. History of cesarean delivery - ROI signed today to get OP report  2. 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  Declined any additional counseling.    3. Supervision of normal pregnancy in second trimester - [place] MFM OB COMP + 14 WK; Future - AFP, Quad Screen  4. Vaginal discharge during pregnancy, second trimester - RX Terazole (+fungal on pap smear)  6. HSV (herpes simplex virus) anogenital infection - Explained with begin prophylaxis treatment at 36 weeks  General obstetric precautions including but not limited to vaginal bleeding and pelvic pain reviewed in detail with the patient. Please refer to After Visit Summary for other counseling recommendations.  Return in about 4 weeks (around 11/16/2015).   Venia Carbon Michiel Cowboy, CNM

## 2015-10-21 ENCOUNTER — Encounter: Payer: Self-pay | Admitting: *Deleted

## 2015-11-09 ENCOUNTER — Ambulatory Visit (HOSPITAL_COMMUNITY): Payer: BC Managed Care – PPO

## 2015-11-12 ENCOUNTER — Ambulatory Visit (HOSPITAL_COMMUNITY)
Admission: RE | Admit: 2015-11-12 | Discharge: 2015-11-12 | Disposition: A | Discharge status: Home / Self Care | Payer: BC Managed Care – PPO | Source: Ambulatory Visit | Attending: Family | Admitting: Family

## 2015-11-12 ENCOUNTER — Other Ambulatory Visit: Payer: Self-pay | Admitting: Family

## 2015-11-12 DIAGNOSIS — Z98891 History of uterine scar from previous surgery: Secondary | ICD-10-CM

## 2015-11-12 DIAGNOSIS — Z36 Encounter for antenatal screening of mother: Secondary | ICD-10-CM | POA: Insufficient documentation

## 2015-11-12 DIAGNOSIS — Z3A20 20 weeks gestation of pregnancy: Secondary | ICD-10-CM

## 2015-11-12 DIAGNOSIS — Z3A19 19 weeks gestation of pregnancy: Secondary | ICD-10-CM | POA: Diagnosis not present

## 2015-11-12 DIAGNOSIS — Z3482 Encounter for supervision of other normal pregnancy, second trimester: Secondary | ICD-10-CM

## 2015-11-12 DIAGNOSIS — Z1389 Encounter for screening for other disorder: Secondary | ICD-10-CM

## 2015-11-12 DIAGNOSIS — IMO0002 Reserved for concepts with insufficient information to code with codable children: Secondary | ICD-10-CM

## 2015-11-12 DIAGNOSIS — O34219 Maternal care for unspecified type scar from previous cesarean delivery: Secondary | ICD-10-CM | POA: Diagnosis not present

## 2015-11-13 ENCOUNTER — Encounter (HOSPITAL_COMMUNITY): Payer: Self-pay | Admitting: *Deleted

## 2015-11-13 ENCOUNTER — Inpatient Hospital Stay (HOSPITAL_COMMUNITY)
Admission: AD | Admit: 2015-11-13 | Discharge: 2015-11-13 | Disposition: A | Discharge status: Home / Self Care | Payer: BC Managed Care – PPO | Source: Ambulatory Visit | Attending: Obstetrics & Gynecology | Admitting: Obstetrics & Gynecology

## 2015-11-13 DIAGNOSIS — O99712 Diseases of the skin and subcutaneous tissue complicating pregnancy, second trimester: Secondary | ICD-10-CM | POA: Diagnosis not present

## 2015-11-13 DIAGNOSIS — L259 Unspecified contact dermatitis, unspecified cause: Secondary | ICD-10-CM | POA: Insufficient documentation

## 2015-11-13 DIAGNOSIS — O9989 Other specified diseases and conditions complicating pregnancy, childbirth and the puerperium: Secondary | ICD-10-CM

## 2015-11-13 DIAGNOSIS — R21 Rash and other nonspecific skin eruption: Secondary | ICD-10-CM | POA: Diagnosis present

## 2015-11-13 DIAGNOSIS — Z3A19 19 weeks gestation of pregnancy: Secondary | ICD-10-CM | POA: Insufficient documentation

## 2015-11-13 NOTE — MAU Provider Note (Signed)
S:  [name] is a 30 y.o. female [phone] at [redacted]w[redacted]d presenting to MAU with complaints of an itchy rash all over her abdomen. The patient had an US done yesterday and shortly after the [place] her abdomen broke out in a rash. She believes the rash was caused by the gel that was used. She denies an Itchy rash anywhere else on her body.    O:  GENERAL: Well-developed, well-nourished female in no acute distress.  LUNGS: Effort normal SKIN: Warm, dry and without erythema PSYCH: Normal mood and affect ABDOMEN: Diffuse, macular rash noted on entire abdomen.   Filed Vitals:   11/13/15 1336  BP: 95/64  Pulse: 97  Temp: 98.2 F (36.8 C)  Resp: 16   MDM:  + fetal heart tones.    A:  1. Contact dermatitis      P:  Discharge home in stable condition Ok to take benadryl over the counter as directed. Ok to use benadryl creams as directed on the bottle Go to your scheduled OB appointment on Tuesday Return to MAU if symptoms worsen.   Lezlie Lye, NP 11/13/2015 1:49 PM

## 2015-11-13 NOTE — Discharge Instructions (Signed)
Contact Dermatitis Dermatitis is redness, soreness, and swelling (inflammation) of the skin. Contact dermatitis is a reaction to certain substances that touch the skin. There are two types of contact dermatitis:   Irritant contact dermatitis. This type is caused by something that irritates your skin, such as dry hands from washing them too much. This type does not require previous exposure to the substance for a reaction to occur. This type is more common.  Allergic contact dermatitis. This type is caused by a substance that you are allergic to, such as a nickel allergy or poison ivy. This type only occurs if you have been exposed to the substance (allergen) before. Upon a repeat exposure, your body reacts to the substance. This type is less common. CAUSES  Many different substances can cause contact dermatitis. Irritant contact dermatitis is most commonly caused by exposure to:   Makeup.   Soaps.   Detergents.   Bleaches.   Acids.   Metal salts, such as nickel.  Allergic contact dermatitis is most commonly caused by exposure to:   Poisonous plants.   Chemicals.   Jewelry.   Latex.   Medicines.   Preservatives in products, such as clothing.  RISK FACTORS This condition is more likely to develop in:   People who have jobs that expose them to irritants or allergens.  People who have certain medical conditions, such as asthma or eczema.  SYMPTOMS  Symptoms of this condition may occur anywhere on your body where the irritant has touched you or is touched by you. Symptoms include:  Dryness or flaking.   Redness.   Cracks.   Itching.   Pain or a burning feeling.   Blisters.  Drainage of small amounts of blood or clear fluid from skin cracks. With allergic contact dermatitis, there may also be swelling in areas such as the eyelids, mouth, or genitals.  DIAGNOSIS  This condition is diagnosed with a medical history and physical exam. A patch skin test  may be performed to help determine the cause. If the condition is related to your job, you may need to see an occupational medicine specialist. TREATMENT Treatment for this condition includes figuring out what caused the reaction and protecting your skin from further contact. Treatment may also include:   Steroid creams or ointments. Oral steroid medicines may be needed in more severe cases.  Antibiotics or antibacterial ointments, if a skin infection is present.  Antihistamine lotion or an antihistamine taken by mouth to ease itching.  A bandage (dressing). HOME CARE INSTRUCTIONS Skin Care  Moisturize your skin as needed.   Apply cool compresses to the affected areas.  Try taking a bath with:  Epsom salts. Follow the instructions on the packaging. You can get these at your local pharmacy or grocery store.  Baking soda. Pour a small amount into the bath as directed by your health care provider.  Colloidal oatmeal. Follow the instructions on the packaging. You can get this at your local pharmacy or grocery store.  Try applying baking soda paste to your skin. Stir water into baking soda until it reaches a paste-like consistency.  Do not scratch your skin.  Bathe less frequently, such as every other day.  Bathe in lukewarm water. Avoid using hot water. Medicines  Take or apply over-the-counter and prescription medicines only as told by your health care provider.   If you were prescribed an antibiotic medicine, take or apply your antibiotic as told by your health care provider. Do not stop using the   antibiotic even if your condition starts to improve. General Instructions  Keep all follow-up visits as told by your health care provider. This is important.  Avoid the substance that caused your reaction. If you do not know what caused it, keep a journal to try to track what caused it. Write down:  What you eat.  What cosmetic products you use.  What you drink.  What  you wear in the affected area. This includes jewelry.  If you were given a dressing, take care of it as told by your health care provider. This includes when to change and remove it. SEEK MEDICAL CARE IF:   Your condition does not improve with treatment.  Your condition gets worse.  You have signs of infection such as swelling, tenderness, redness, soreness, or warmth in the affected area.  You have a fever.  You have new symptoms. SEEK IMMEDIATE MEDICAL CARE IF:   You have a severe headache, neck pain, or neck stiffness.  You vomit.  You feel very sleepy.  You notice red streaks coming from the affected area.  Your bone or joint underneath the affected area becomes painful after the skin has healed.  The affected area turns darker.  You have difficulty breathing.   This information is not intended to replace advice given to you by your health care provider. Make sure you discuss any questions you have with your health care provider.   Document Released: 08/11/2000 Document Revised: 05/05/2015 Document Reviewed: 12/30/2014 Elsevier Interactive Patient Education 2016 Elsevier Inc.  

## 2015-11-13 NOTE — MAU Note (Addendum)
Patient was seen yesterday. An U/S was performed. Patient thinks she had an allergic reaction to the gel used for the U/S. Small, raised, red rash noted on abdomen that patient states itches.  Fetus active. Denies pain, bleeding or discharge.

## 2015-11-16 ENCOUNTER — Ambulatory Visit (INDEPENDENT_AMBULATORY_CARE_PROVIDER_SITE_OTHER): Payer: BC Managed Care – PPO | Admitting: Obstetrics and Gynecology

## 2015-11-16 VITALS — BP 94/64 | HR 93 | Temp 98.7°F | Wt 169.8 lb

## 2015-11-16 DIAGNOSIS — O283 Abnormal ultrasonic finding on antenatal screening of mother: Secondary | ICD-10-CM

## 2015-11-16 DIAGNOSIS — Z3492 Encounter for supervision of normal pregnancy, unspecified, second trimester: Secondary | ICD-10-CM

## 2015-11-16 DIAGNOSIS — O35EXX0 Maternal care for other (suspected) fetal abnormality and damage, fetal genitourinary anomalies, not applicable or unspecified: Secondary | ICD-10-CM

## 2015-11-16 DIAGNOSIS — O358XX0 Maternal care for other (suspected) fetal abnormality and damage, not applicable or unspecified: Secondary | ICD-10-CM | POA: Insufficient documentation

## 2015-11-16 DIAGNOSIS — Z3482 Encounter for supervision of other normal pregnancy, second trimester: Secondary | ICD-10-CM

## 2015-11-16 LAB — POCT URINALYSIS DIP (DEVICE)
Bilirubin Urine: NEGATIVE
GLUCOSE, UA: NEGATIVE mg/dL
Hgb urine dipstick: NEGATIVE
Ketones, ur: NEGATIVE mg/dL
LEUKOCYTES UA: NEGATIVE
NITRITE: NEGATIVE
PROTEIN: NEGATIVE mg/dL
SPECIFIC GRAVITY, URINE: 1.015 (ref 1.005–1.030)
UROBILINOGEN UA: 1 mg/dL (ref 0.0–1.0)
pH: 6 (ref 5.0–8.0)

## 2015-11-16 NOTE — Progress Notes (Signed)
Subjective:  Amanda [name] is a 30 y.o. G5P1031 at [redacted]w[redacted]d being seen today for ongoing prenatal care.  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  Patient reports no complaints. Seen MAU for abd rash> contact dermatitis. Sx much improved on Benadryl.  Contractions: Not present. Vag. Bleeding: None.  Movement: Present. Denies leaking of fluid.   The following portions of the patient's history were reviewed and updated as appropriate: allergies, current medications, past family history, past medical history, past social history, past surgical history and problem list. Problem list updated.  Objective:   Filed Vitals:   11/16/15 1045  BP: 94/64  Pulse: 93  Temp: 98.7 F (37.1 C)  Weight: 169 lb 12.8 oz (77.021 kg)    Fetal Status: Fetal Heart Rate (bpm): 147   Movement: Present     General:  Alert, oriented and cooperative. Patient is in no acute distress.  Skin: Skin is warm and dry. No rash noted.   Cardiovascular: Normal heart rate noted  Respiratory: Normal respiratory effort, no problems with respiration noted  Abdomen: Soft, gravid, appropriate for gestational age. Pain/Pressure: Absent     Pelvic: Vag. Bleeding: None     Cervical exam deferred        Extremities: Normal range of motion.  Edema: None  Mental Status: Normal mood and affect. Normal behavior. Normal judgment and thought content.   Urinalysis:      Assessment and Plan:  Pregnancy: G5P1031 at [redacted]w[redacted]d  1. Supervision of normal pregnancy in second trimester No HSV outbreak> prophylactic Valtrex at 36 wks  2. Supervision of normal pregnancy, antepartum, second trimester Doing well  3. Pyelectasis of fetus on prenatal ultrasound Understands aneuploidy  markers and has F/U US with MFM in May  HGB AS, partner AS also> counseled  Previous C/S> ROI was signed, op note not yet in record. Desires TOLAC Preterm labor symptoms and general obstetric precautions including but not limited to vaginal bleeding, contractions, leaking of fluid and fetal movement were reviewed in detail with the patient. Please refer to After Visit Summary for other counseling recommendations.  Return in about 1 month (around 12/17/2015).   Lorene Dy, CNM

## 2015-11-16 NOTE — Patient Instructions (Signed)

## 2015-11-18 ENCOUNTER — Encounter: Payer: Self-pay | Admitting: *Deleted

## 2015-11-18 DIAGNOSIS — F32A Depression, unspecified: Secondary | ICD-10-CM | POA: Insufficient documentation

## 2015-11-18 DIAGNOSIS — F329 Major depressive disorder, single episode, unspecified: Secondary | ICD-10-CM | POA: Insufficient documentation

## 2015-11-18 DIAGNOSIS — F411 Generalized anxiety disorder: Secondary | ICD-10-CM | POA: Insufficient documentation

## 2015-11-24 ENCOUNTER — Telehealth: Payer: Self-pay | Admitting: *Deleted

## 2015-11-24 NOTE — Telephone Encounter (Signed)
Patient had met with Cardell Peach, LCSW and asked to come out of work due to anxiety and depression.  Patient also requested permission to participate in exercise/yoga program.  Spoke with Dr. Kennon Rounds on 11/23/15.  Okay for patient to participate in exercise/yoga program.  Recommended patient see a Behavioral Health Clinician if she feels her anxiety and depression requires her to come out of work.  Concerned that being out of work may make anxiety/depression worse.  Attempted to contact patient via phone.  No answer and mailbox was full so I couldn't leave a message.  Will try to contact again later.

## 2015-11-25 NOTE — Telephone Encounter (Signed)
Consult with Cathlean Marseilles, clinic manager, who advised that Dr. Kennon Rounds has concerns regarding this pt.'s request to come out of work due to depression and anxiety.  SW called pt. and she answered the phone. Pt. advised that MD has been consulted regarding her request to come out of work. SW told pt. MD has concerns that coming out of work may worsen her condition but supported pt. request to participate in exercise and yoga program. Pt. Informed that MD has recommended that she see a Special educational needs teacher. Pt. told SW she planned to come and see her tomorrow. SW told her that she will not be in office tomorrow. Appt. Scheduled for Tuesday, April 4 12:15 [facility] office) after pt's yoga/excercise class. SW inquired about pt. not going to work when she informed SW that she plans to attend these classes 3 times next week and the class is at 10:45 am. Pt. States she has not been going to work but denies she has been terminated. SW will explore further with pt. at appt. Tuesday.

## 2015-12-14 ENCOUNTER — Ambulatory Visit (INDEPENDENT_AMBULATORY_CARE_PROVIDER_SITE_OTHER): Payer: Medicaid Other | Admitting: Certified Nurse Midwife

## 2015-12-14 VITALS — BP 107/70 | HR 101 | Wt 173.0 lb

## 2015-12-14 DIAGNOSIS — Z3482 Encounter for supervision of other normal pregnancy, second trimester: Secondary | ICD-10-CM

## 2015-12-14 DIAGNOSIS — Z98891 History of uterine scar from previous surgery: Secondary | ICD-10-CM

## 2015-12-14 DIAGNOSIS — O34219 Maternal care for unspecified type scar from previous cesarean delivery: Secondary | ICD-10-CM | POA: Diagnosis not present

## 2015-12-14 LAB — POCT URINALYSIS DIP (DEVICE)
Bilirubin Urine: NEGATIVE
Glucose, UA: NEGATIVE mg/dL
Hgb urine dipstick: NEGATIVE
Ketones, ur: NEGATIVE mg/dL
Nitrite: NEGATIVE
Protein, ur: NEGATIVE mg/dL
Specific Gravity, Urine: 1.02 (ref 1.005–1.030)
Urobilinogen, UA: 1 mg/dL (ref 0.0–1.0)
pH: 7 (ref 5.0–8.0)

## 2015-12-14 NOTE — Progress Notes (Signed)
Subjective:  Amanda [name] is a 30 y.o. G5P1031 at [redacted]w[redacted]d being seen today for ongoing prenatal care.  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  Patient reports no complaints.  Contractions: Not present.  .  Movement: Present. Denies leaking of fluid.   The following portions of the patient's history were reviewed and updated as appropriate: allergies, current medications, past family history, past medical history, past social history, past surgical history and problem list. Problem list updated.  Objective:   Filed Vitals:   12/14/15 1005  BP: 107/70  Pulse: 101  Weight: 173 lb (78.472 kg)    Fetal Status: Fetal Heart Rate (bpm): 154   Movement: Present     General:  Alert, oriented and cooperative. Patient is in no acute distress.  Skin: Skin is warm and dry. No rash noted.   Cardiovascular: Normal heart rate noted  Respiratory: Normal respiratory effort, no problems with respiration noted  Abdomen: Soft, gravid, appropriate for gestational age. Pain/Pressure: Present     Pelvic:       Cervical exam deferred        Extremities: Normal range of motion.     Mental Status: Normal mood and affect. Normal behavior. Normal judgment and thought content.   Urinalysis: Urine Protein: Negative Urine Glucose: Negative  Assessment and Plan:  Pregnancy: [id number] at [redacted]w[redacted]d  1. History of cesarean delivery   2. Supervision of normal pregnancy, antepartum, second trimester   Preterm labor symptoms and general obstetric precautions including but not limited to vaginal bleeding, contractions, leaking of fluid and fetal movement were reviewed in detail with the patient. Please refer to After Visit  Summary for other counseling recommendations.  Return in about 4 weeks (around 01/11/2016) for 1 hour gtt.   Larey Days, CNM

## 2015-12-14 NOTE — Patient Instructions (Signed)
Glucose Tolerance Test The glucose tolerance test (GTT) is one of several tests used to diagnose diabetes mellitus. The GTT is a blood test, and it may include a urine test as well. The GTT checks to see how your body processes sugar (glucose). For this test, you will consume a drink containing a high level of glucose. Your blood glucose levels will be checked before you consume the drink and then again 1, 2, 3, and possibly 4 hours after you consume it. Your health care provider may recommend that you have the GTT if you:  Have a family history of diabetes.   Are very overweight (obese).   Have experienced infections that keep coming back.   Have had numerous cuts or wounds that did not heal quickly, especially on your legs and feet.   Are a woman and have a history of giving birth to very large babies or a history of repeated fetal loss (stillbirth).  Have had glucose in your urine or high blood sugar:   During pregnancy.   After a heart attack, surgery, or prolonged periods of high stress.  The GTT lasts 3-4 hours. Other than the glucose solution, you will not be allowed to eat or drink anything during the test. You must remain at the testing location to make sure that your blood and urine samples are taken on time. PREPARATION FOR TEST Eat normally for 3 days prior to the GTT test, including having plenty of carbohydrate-rich foods. Do not eat or drink anything except water during the final 12 hours before the test. You should not smoke or exercise during the test. In addition, your health care provider may ask you to stop taking certain medicines before the test. RESULTS It is your responsibility to obtain your test results. Ask the lab or department performing the test when and how you will get your results. Contact your health care provider to discuss any questions you have about your results. Range of Normal Values Ranges for normal values may vary among different labs and  hospitals. You should always check with your health care provider after having lab work or other tests done to discuss whether your values are considered within normal limits.  Normal levels of blood glucose are as follows:  Fasting: less than 110 mg/dL or less than 6.1 mmol/L (SI units).  1 hour after consuming the glucose drink: less than 200 mg/dL or less than 11.1 mmol/L.  2 hours after consuming the glucose drink: less than 140 mg/dL or less than 7.8 mmol/L.  3 hours after consuming the glucose drink: 70-115 mg/dL or less than 6.4 mmol/L.  4 hours after consuming the glucose drink: 70-115 mg/dL or less than 6.4 mmol/L. The normal result for the urine test is negative, meaning that glucose is absent from your urine. Some substances can interfere with GTT results. These may include:  Blood pressure and heart failure medicines, including beta blockers, furosemide, and thiazides.   Anti-inflammatory medicines, including aspirin.   Nicotine.   Some psychiatric medicines.   Oral contraceptives.   Diuretics or corticosteroids. Meaning of Results Outside Normal Value Ranges GTT test results that are above normal values may indicate health problems, such as:  Diabetes mellitus.   Acute stress response.   Cushing syndrome.   Tumors such as pheochromocytoma or glucagonoma.   Chronic renal failure.   Pancreatitis.   Hyperthyroidism.   Current infection.  Discuss your test results with your health care provider. He or she will use the results   to make a diagnosis and determine a treatment plan that is right for you.   This information is not intended to replace advice given to you by your health care provider. Make sure you discuss any questions you have with your health care provider.   Document Released: 09/06/2004 Document Revised: 09/04/2014 Document Reviewed: 12/19/2013 Elsevier Interactive Patient Education 2016 Elsevier Inc.  

## 2015-12-24 ENCOUNTER — Telehealth: Payer: Self-pay | Admitting: *Deleted

## 2015-12-24 NOTE — Telephone Encounter (Signed)
Patient phoned this morning wanting to know if we were going to complete her paperwork for FMLA.  She states she didn't get paid because her job hasn't received her paperwork.  I explained to patient that our providers are not comfortable with taking her out of work for anxiety and depression for 2 reasons - being out of work might make her anxiety and depression worse and these diagnoses are outside our area of expertise.  Patient states her HR department says it is fine for her to be out of work but we need to complete the paperwork.  I again explain to patient we cannot take her out of work for anxiety and depression.  I asked the patient if she has a psychiatrist or counselor that she has worked with in the past before she got pregnant.  Patient states she doesn't.  I encouraged patient to go to [facility] to be evaluated to see if they feel her anxiety and depression are severe enough that they can take her out of work.  The patient states she doesn't have money to see anyone.  I explained [facility] would see her without payment.  Patient states she has no where to stay because she didn't get paid.  I explained Cardell Peach, LCSW could probably assist her with housing.  The patient states she doesn't want any more help from [place].  States she doesn't want any help from [place] either but no one else in town will see her because she has Medicaid.  I told patient there was other offices in town that accept Medicaid.  Patient states they are all full and will not take her.  I again explained we couldn't take her out of work at this point.  Patient is emotional and disconnects the call.

## 2016-01-07 ENCOUNTER — Ambulatory Visit (HOSPITAL_COMMUNITY)
Admission: RE | Admit: 2016-01-07 | Discharge: 2016-01-07 | Disposition: A | Discharge status: Home / Self Care | Payer: BC Managed Care – PPO | Source: Ambulatory Visit | Attending: Family | Admitting: Family

## 2016-01-07 ENCOUNTER — Other Ambulatory Visit (HOSPITAL_COMMUNITY): Payer: Self-pay | Admitting: Maternal and Fetal Medicine

## 2016-01-07 ENCOUNTER — Encounter (HOSPITAL_COMMUNITY): Payer: Self-pay

## 2016-01-07 VITALS — BP 112/67 | HR 102 | Wt 179.2 lb

## 2016-01-07 DIAGNOSIS — IMO0002 Reserved for concepts with insufficient information to code with codable children: Secondary | ICD-10-CM

## 2016-01-07 DIAGNOSIS — Z3A27 27 weeks gestation of pregnancy: Secondary | ICD-10-CM | POA: Insufficient documentation

## 2016-01-07 DIAGNOSIS — O359XX0 Maternal care for (suspected) fetal abnormality and damage, unspecified, not applicable or unspecified: Secondary | ICD-10-CM

## 2016-01-07 DIAGNOSIS — O34219 Maternal care for unspecified type scar from previous cesarean delivery: Secondary | ICD-10-CM | POA: Diagnosis not present

## 2016-01-07 DIAGNOSIS — Z36 Encounter for antenatal screening of mother: Secondary | ICD-10-CM | POA: Diagnosis not present

## 2016-01-07 DIAGNOSIS — Z1389 Encounter for screening for other disorder: Secondary | ICD-10-CM

## 2016-01-07 DIAGNOSIS — O358XX0 Maternal care for other (suspected) fetal abnormality and damage, not applicable or unspecified: Secondary | ICD-10-CM | POA: Diagnosis present

## 2016-01-07 DIAGNOSIS — O35EXX0 Maternal care for other (suspected) fetal abnormality and damage, fetal genitourinary anomalies, not applicable or unspecified: Secondary | ICD-10-CM

## 2016-01-10 ENCOUNTER — Encounter: Payer: Self-pay | Admitting: *Deleted

## 2016-01-11 ENCOUNTER — Ambulatory Visit (INDEPENDENT_AMBULATORY_CARE_PROVIDER_SITE_OTHER): Payer: BC Managed Care – PPO | Admitting: Family

## 2016-01-11 VITALS — BP 105/67 | HR 105 | Wt 178.0 lb

## 2016-01-11 DIAGNOSIS — Z23 Encounter for immunization: Secondary | ICD-10-CM

## 2016-01-11 DIAGNOSIS — Z98891 History of uterine scar from previous surgery: Secondary | ICD-10-CM

## 2016-01-11 DIAGNOSIS — O358XX0 Maternal care for other (suspected) fetal abnormality and damage, not applicable or unspecified: Secondary | ICD-10-CM

## 2016-01-11 DIAGNOSIS — Z3482 Encounter for supervision of other normal pregnancy, second trimester: Secondary | ICD-10-CM

## 2016-01-11 DIAGNOSIS — O34219 Maternal care for unspecified type scar from previous cesarean delivery: Secondary | ICD-10-CM

## 2016-01-11 DIAGNOSIS — O98313 Other infections with a predominantly sexual mode of transmission complicating pregnancy, third trimester: Secondary | ICD-10-CM

## 2016-01-11 DIAGNOSIS — A609 Anogenital herpesviral infection, unspecified: Secondary | ICD-10-CM

## 2016-01-11 DIAGNOSIS — O35EXX0 Maternal care for other (suspected) fetal abnormality and damage, fetal genitourinary anomalies, not applicable or unspecified: Secondary | ICD-10-CM

## 2016-01-11 DIAGNOSIS — A6009 Herpesviral infection of other urogenital tract: Secondary | ICD-10-CM

## 2016-01-11 DIAGNOSIS — O283 Abnormal ultrasonic finding on antenatal screening of mother: Secondary | ICD-10-CM

## 2016-01-11 LAB — POCT URINALYSIS DIP (DEVICE)
Bilirubin Urine: NEGATIVE
GLUCOSE, UA: NEGATIVE mg/dL
Hgb urine dipstick: NEGATIVE
Ketones, ur: NEGATIVE mg/dL
NITRITE: NEGATIVE
PROTEIN: NEGATIVE mg/dL
SPECIFIC GRAVITY, URINE: 1.02 (ref 1.005–1.030)
UROBILINOGEN UA: 0.2 mg/dL (ref 0.0–1.0)
pH: 5.5 (ref 5.0–8.0)

## 2016-01-11 LAB — CBC
HCT: 29.4 % — ABNORMAL LOW (ref 35.0–45.0)
Hemoglobin: 10.3 g/dL — ABNORMAL LOW (ref 11.7–15.5)
MCH: 29.3 pg (ref 27.0–33.0)
MCHC: 35 g/dL (ref 32.0–36.0)
MCV: 83.8 fL (ref 80.0–100.0)
MPV: 9.4 fL (ref 7.5–12.5)
PLATELETS: 168 10*3/uL (ref 140–400)
RBC: 3.51 MIL/uL — AB (ref 3.80–5.10)
RDW: 14.4 % (ref 11.0–15.0)
WBC: 7 10*3/uL (ref 3.8–10.8)

## 2016-01-11 MED ORDER — TETANUS-DIPHTH-ACELL PERTUSSIS 5-2.5-18.5 LF-MCG/0.5 IM SUSP
0.5000 mL | Freq: Once | INTRAMUSCULAR | Status: AC
  Administered 2016-01-11: 0.5 mL via INTRAMUSCULAR

## 2016-01-11 NOTE — Addendum Note (Signed)
Addended by: Riccardo Dubin on: 01/11/2016 09:27 AM   Modules accepted: Orders

## 2016-01-11 NOTE — Progress Notes (Signed)
Subjective:  Amanda [name] is a 30 y.o. G5P1031 at [redacted]w[redacted]d being seen today for ongoing prenatal care.  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  Patient reports vaginal irritation a week ago, treated with acyclovir.  Contractions: Not present. Vag. Bleeding: None.  Movement: Present. Denies leaking of fluid.   The following portions of the patient's history were reviewed and updated as appropriate: allergies, current medications, past family history, past medical history, past social history, past surgical history and problem list. Problem list updated.  Objective:   Filed Vitals:   01/11/16 0811  BP: 105/67  Pulse: 105  Weight: 178 lb (80.74 kg)    Fetal Status: Fetal Heart Rate (bpm): 151 Fundal Height: 28 cm Movement: Present     General:  Alert, oriented and cooperative. Patient is in no acute distress.  Skin: Skin is warm and dry. No rash noted.   Cardiovascular: Normal heart rate noted  Respiratory: Normal respiratory effort, no problems with respiration noted  Abdomen: Soft, gravid, appropriate for gestational age. Pain/Pressure: Present     Pelvic: Vag. Bleeding: None     Cervical exam deferred        Extremities: Normal range of motion.  Edema: None  Mental Status: Normal mood and affect. Normal behavior. Normal judgment and thought content.   Urinalysis: Urine Protein: Negative Urine Glucose: Negative  Assessment and Plan:  Pregnancy: [id number] at [redacted]w[redacted]d  1. Supervision of normal pregnancy, antepartum, second trimester - CBC - RPR - HIV antibody (with reflex) - Glucose Tolerance, 1 HR (50g) w/o Fasting - Reviewed breastfeeding strategies/benefits (colostrum), slings  2.  Pyelectasis of fetus on prenatal ultrasound - Rescan in 4-6 wks  3. HSV (herpes simplex virus) anogenital infection - Discussed using acyclovir in third trimester to prevent outbreak and why  4. History of cesarean delivery - TOLAC consent given to review today  Preterm labor symptoms and general obstetric precautions including but not limited to vaginal bleeding, contractions, leaking of fluid and fetal movement were reviewed in detail with the patient. Please refer to After Visit Summary for other counseling recommendations.  Return in about 2 weeks (around 01/25/2016).   Venia Carbon Michiel Cowboy, CNM

## 2016-01-12 LAB — GLUCOSE TOLERANCE, 1 HOUR (50G) W/O FASTING: GLUCOSE, 1 HR, GESTATIONAL: 180 mg/dL — AB (ref <140)

## 2016-01-12 LAB — HIV ANTIBODY (ROUTINE TESTING W REFLEX): HIV: NONREACTIVE

## 2016-01-13 LAB — RPR

## 2016-01-26 ENCOUNTER — Ambulatory Visit (INDEPENDENT_AMBULATORY_CARE_PROVIDER_SITE_OTHER): Payer: BC Managed Care – PPO | Admitting: Family

## 2016-01-26 VITALS — BP 100/63 | HR 108 | Wt 181.5 lb

## 2016-01-26 DIAGNOSIS — Z3483 Encounter for supervision of other normal pregnancy, third trimester: Secondary | ICD-10-CM | POA: Diagnosis not present

## 2016-01-26 DIAGNOSIS — Z98891 History of uterine scar from previous surgery: Secondary | ICD-10-CM

## 2016-01-26 DIAGNOSIS — N898 Other specified noninflammatory disorders of vagina: Secondary | ICD-10-CM

## 2016-01-26 DIAGNOSIS — L298 Other pruritus: Secondary | ICD-10-CM

## 2016-01-26 LAB — POCT URINALYSIS DIP (DEVICE)
Bilirubin Urine: NEGATIVE
GLUCOSE, UA: NEGATIVE mg/dL
Hgb urine dipstick: NEGATIVE
Ketones, ur: NEGATIVE mg/dL
Nitrite: NEGATIVE
PROTEIN: NEGATIVE mg/dL
Specific Gravity, Urine: 1.02 (ref 1.005–1.030)
UROBILINOGEN UA: 1 mg/dL (ref 0.0–1.0)
pH: 5.5 (ref 5.0–8.0)

## 2016-01-26 MED ORDER — TERCONAZOLE 0.4 % VA CREA
1.0000 | TOPICAL_CREAM | Freq: Every day | VAGINAL | Status: DC
Start: 2016-01-26 — End: 2016-03-15

## 2016-01-26 NOTE — Progress Notes (Signed)
Subjective:  Amanda [name] is a 30 y.o. G5P1031 at [redacted]w[redacted]d being seen today for ongoing prenatal care.  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  Patient reports vaginal itching and white discharge.  Declines a pelvic, desires treatment. .  Contractions: Not present. Vag. Bleeding: None.  Movement: Present. Denies leaking of fluid.   The following portions of the patient's history were reviewed and updated as appropriate: allergies, current medications, past family history, past medical history, past social history, past surgical history and problem list. Problem list updated.  Objective:   Filed Vitals:   01/26/16 0907  BP: 100/63  Pulse: 108  Weight: 181 lb 8 oz (82.328 kg)    Fetal Status: Fetal Heart Rate (bpm): 150 Fundal Height: 31 cm Movement: Present     General:  Alert, oriented and cooperative. Patient is in no acute distress.  Skin: Skin is warm and dry. No rash noted.   Cardiovascular: Normal heart rate noted  Respiratory: Normal respiratory effort, no problems with respiration noted  Abdomen: Soft, gravid, appropriate for gestational age. Pain/Pressure: Present     Pelvic: Vag. Bleeding: None     Cervical exam deferred        Extremities: Normal range of motion.  Edema: None  Mental Status: Normal mood and affect. Normal behavior. Normal judgment and thought content.   Urinalysis:    Protein negative  Glucose negative  Assessment and Plan:  Pregnancy: [id number] at [redacted]w[redacted]d  1. Vaginal itching - terconazole (TERAZOL 7) 0.4 % vaginal cream; Place 1 applicator vaginally at bedtime.  Dispense: 45 g; Refill: 0  2. History of cesarean delivery - TOLAC consent obtained today  Preterm  labor symptoms and general obstetric precautions including but not limited to vaginal bleeding, contractions, leaking of fluid and fetal movement were reviewed in detail with the patient. Please refer to After Visit Summary for other counseling recommendations.  Return in about 2 weeks (around 02/09/2016).   Amanda [name] Michiel Cowboy, CNM

## 2016-01-27 ENCOUNTER — Encounter: Payer: Self-pay | Admitting: *Deleted

## 2016-01-30 ENCOUNTER — Encounter (HOSPITAL_COMMUNITY): Payer: Self-pay | Admitting: *Deleted

## 2016-01-30 ENCOUNTER — Inpatient Hospital Stay (HOSPITAL_COMMUNITY)
Admission: AD | Admit: 2016-01-30 | Discharge: 2016-01-30 | Disposition: A | Discharge status: Home / Self Care | Payer: Medicaid Other | Source: Ambulatory Visit | Attending: Obstetrics & Gynecology | Admitting: Obstetrics & Gynecology

## 2016-01-30 DIAGNOSIS — Z3A30 30 weeks gestation of pregnancy: Secondary | ICD-10-CM | POA: Insufficient documentation

## 2016-01-30 DIAGNOSIS — D573 Sickle-cell trait: Secondary | ICD-10-CM | POA: Insufficient documentation

## 2016-01-30 DIAGNOSIS — M25552 Pain in left hip: Secondary | ICD-10-CM | POA: Diagnosis not present

## 2016-01-30 DIAGNOSIS — M25559 Pain in unspecified hip: Secondary | ICD-10-CM

## 2016-01-30 DIAGNOSIS — O26893 Other specified pregnancy related conditions, third trimester: Secondary | ICD-10-CM | POA: Insufficient documentation

## 2016-01-30 DIAGNOSIS — Z87891 Personal history of nicotine dependence: Secondary | ICD-10-CM | POA: Diagnosis not present

## 2016-01-30 LAB — URINE MICROSCOPIC-ADD ON
Bacteria, UA: NONE SEEN
RBC / HPF: NONE SEEN RBC/hpf (ref 0–5)

## 2016-01-30 LAB — URINALYSIS, ROUTINE W REFLEX MICROSCOPIC
BILIRUBIN URINE: NEGATIVE
GLUCOSE, UA: NEGATIVE mg/dL
Hgb urine dipstick: NEGATIVE
KETONES UR: NEGATIVE mg/dL
Nitrite: NEGATIVE
PH: 8 (ref 5.0–8.0)
Protein, ur: NEGATIVE mg/dL
Specific Gravity, Urine: 1.015 (ref 1.005–1.030)

## 2016-01-30 MED ORDER — CYCLOBENZAPRINE HCL 10 MG PO TABS: 10.0000 mg | ORAL_TABLET | Freq: Once | ORAL | Status: DC

## 2016-01-30 NOTE — MAU Note (Signed)
Pt states she started having hip pain at 28 weeks and pt states it started to radiate down her left leg last night.

## 2016-01-30 NOTE — Discharge Instructions (Signed)

## 2016-01-30 NOTE — MAU Provider Note (Signed)
History     CSN: [id number]  Arrival date and time: 01/30/16 1243   First Provider Initiated Contact with Patient 01/30/16 1350      Chief Complaint  Patient presents with  . Hip Pain   HPI Amanda [name] is a 30 y.o. [id number] at [redacted]w[redacted]d who presents with hip pain. This is a constant pain she has had for the last 3 weeks. Pain is worse with movement & walking. Pain radiates from left lower back/hip area down to her foot. Denies saddle anesthesia, injury/fall, or urine/fecal incontinence. Denies abdominal pain/contractions, vaginal bleeding, LOF. Positive fetal movement. Has not treated pain.   OB History    Gravida Para Term Preterm AB TAB SAB Ectopic Multiple Living   5 1 1  3 1 1 1  1       Past Medical History  Diagnosis Date  . Hypoglycemia   . Anemia   . Sickle cell trait [facility])     Past Surgical History  Procedure Laterality Date  . Hernia repair    . Umbilical hernia repair    . Cesarean section      FTP  . Dilation and curettage of uterus      Family History  Problem Relation Age of Onset  . Hyperlipidemia Mother   . Diabetes Father   . Hepatitis C Father   . Sickle cell anemia Father     Social History  Substance Use Topics  . Smoking status: Former Smoker    Quit date: 11/18/2009  . Smokeless tobacco: Never Used  . Alcohol Use: No    Allergies: No Known Allergies  Prescriptions prior to admission  Medication Sig Dispense Refill Last Dose  . Prenatal Vit-Fe Fumarate-FA (PRENATAL VITAMINS PLUS PO) Take 1 tablet by mouth daily.   01/29/2016 at Unknown time  . terconazole (TERAZOL 7) 0.4 % vaginal cream Place 1 applicator vaginally at bedtime. 45 g 0 01/29/2016 at Unknown time  . valACYclovir (VALTREX) 1000 MG tablet TAKE 1 TABLET (1,000 MG TOTAL) BY MOUTH 2 (TWO) TIMES DAILY FOR 5 DAYS THEN 1 DAILY FOR 1 YEAR.   "OFFICE VISIT NEEDED FOR REFILLS" (Patient not taking: Reported on 01/11/2016) 30 tablet 0 Not Taking    Review of Systems  Constitutional:  Negative.   Gastrointestinal: Negative.   Genitourinary: Negative.   Musculoskeletal: Positive for back pain. Negative for falls.       + hip pain  Neurological: Negative for focal weakness.   Physical Exam   Blood pressure 107/64, pulse 110, temperature 99 F (37.2 C), temperature source Oral, resp. rate 18, last menstrual period 06/29/2015, unknown if currently breastfeeding.  Physical Exam  Nursing note and vitals reviewed. Constitutional: She is oriented to person, place, and time. She appears well-developed and well-nourished. No distress.  HENT:  Head: Normocephalic and atraumatic.  Eyes: Conjunctivae are normal. Right eye exhibits no discharge. Left eye exhibits no discharge. No scleral icterus.  Neck: Normal range of motion.  Cardiovascular: Normal rate, regular rhythm and normal heart sounds.   No murmur heard. Respiratory: Effort normal and breath sounds normal. No respiratory distress. She has no wheezes.  GI: Soft. There is no tenderness.  Musculoskeletal: She exhibits no edema or tenderness.  LLE rom difficult d/t pain  Neurological: She is alert and oriented to person, place, and time. She has normal reflexes.  Skin: Skin is warm and dry. She is not diaphoretic.  Psychiatric: She has a normal mood and affect. Her behavior is normal. Judgment and thought  content normal.   Dilation: Closed Exam by:: Jorje Guild, NP  Fetal Tracing:  Baseline: 150 Variability: moderate Accelerations: 15x15 Decelerations: none  Toco: UI & few irregular ctx not felt by pt   MAU Course  Procedures Results for orders placed or performed during the hospital encounter of 01/30/16 (from the past 24 hour(s))  Urinalysis, Routine w reflex microscopic (not at [facility])     Status: Abnormal   Collection Time: 01/30/16 12:55 PM  Result Value Ref Range   Color, Urine YELLOW YELLOW   APPearance CLEAR CLEAR   Specific Gravity, Urine 1.015 1.005 - 1.030   pH 8.0 5.0 - 8.0   Glucose, UA  NEGATIVE NEGATIVE mg/dL   Hgb urine dipstick NEGATIVE NEGATIVE   Bilirubin Urine NEGATIVE NEGATIVE   Ketones, ur NEGATIVE NEGATIVE mg/dL   Protein, ur NEGATIVE NEGATIVE mg/dL   Nitrite NEGATIVE NEGATIVE   Leukocytes, UA TRACE (A) NEGATIVE  Urine microscopic-add on     Status: Abnormal   Collection Time: 01/30/16 12:55 PM  Result Value Ref Range   Squamous Epithelial / LPF 6-30 (A) NONE SEEN   WBC, UA 0-5 0 - 5 WBC/hpf   RBC / HPF NONE SEEN 0 - 5 RBC/hpf   Bacteria, UA NONE SEEN NONE SEEN   Urine-Other MUCOUS PRESENT     MDM Reactive fetal tracing with some UI Cervix closed Ordered flexeril -- pt initially open to receiving pain medication in MAU, but after speaking with spouse decided against it & is requesting to go home  Assessment and Plan  A: 1. Pregnancy related hip pain in third trimester, antepartum     P; Discharge home Rx maternity support belt  Ice/heat to area Slow position changes Tylenol as needed  Jorje Guild 01/30/2016, 1:49 PM

## 2016-02-04 ENCOUNTER — Inpatient Hospital Stay (HOSPITAL_COMMUNITY)
Admission: AD | Admit: 2016-02-04 | Discharge: 2016-02-05 | Disposition: A | Discharge status: Home / Self Care | Payer: Medicaid Other | Source: Ambulatory Visit | Attending: Obstetrics & Gynecology | Admitting: Obstetrics & Gynecology

## 2016-02-04 DIAGNOSIS — R51 Headache: Secondary | ICD-10-CM | POA: Insufficient documentation

## 2016-02-04 DIAGNOSIS — R110 Nausea: Secondary | ICD-10-CM | POA: Insufficient documentation

## 2016-02-04 DIAGNOSIS — B9789 Other viral agents as the cause of diseases classified elsewhere: Secondary | ICD-10-CM

## 2016-02-04 DIAGNOSIS — R05 Cough: Secondary | ICD-10-CM | POA: Insufficient documentation

## 2016-02-04 DIAGNOSIS — Z3A31 31 weeks gestation of pregnancy: Secondary | ICD-10-CM | POA: Insufficient documentation

## 2016-02-04 DIAGNOSIS — O26893 Other specified pregnancy related conditions, third trimester: Secondary | ICD-10-CM | POA: Insufficient documentation

## 2016-02-04 DIAGNOSIS — J029 Acute pharyngitis, unspecified: Secondary | ICD-10-CM | POA: Insufficient documentation

## 2016-02-04 DIAGNOSIS — Z98891 History of uterine scar from previous surgery: Secondary | ICD-10-CM

## 2016-02-04 DIAGNOSIS — Z87891 Personal history of nicotine dependence: Secondary | ICD-10-CM | POA: Insufficient documentation

## 2016-02-04 DIAGNOSIS — J069 Acute upper respiratory infection, unspecified: Secondary | ICD-10-CM

## 2016-02-05 ENCOUNTER — Encounter (HOSPITAL_COMMUNITY): Payer: Self-pay

## 2016-02-05 DIAGNOSIS — J029 Acute pharyngitis, unspecified: Secondary | ICD-10-CM | POA: Diagnosis present

## 2016-02-05 DIAGNOSIS — O26893 Other specified pregnancy related conditions, third trimester: Secondary | ICD-10-CM | POA: Diagnosis not present

## 2016-02-05 DIAGNOSIS — R51 Headache: Secondary | ICD-10-CM | POA: Diagnosis not present

## 2016-02-05 DIAGNOSIS — Z3A31 31 weeks gestation of pregnancy: Secondary | ICD-10-CM | POA: Diagnosis not present

## 2016-02-05 DIAGNOSIS — R110 Nausea: Secondary | ICD-10-CM | POA: Diagnosis not present

## 2016-02-05 DIAGNOSIS — Z87891 Personal history of nicotine dependence: Secondary | ICD-10-CM | POA: Diagnosis not present

## 2016-02-05 DIAGNOSIS — R05 Cough: Secondary | ICD-10-CM | POA: Diagnosis present

## 2016-02-05 LAB — URINALYSIS, ROUTINE W REFLEX MICROSCOPIC
BILIRUBIN URINE: NEGATIVE
Glucose, UA: NEGATIVE mg/dL
HGB URINE DIPSTICK: NEGATIVE
Ketones, ur: 15 mg/dL — AB
Leukocytes, UA: NEGATIVE
Nitrite: NEGATIVE
Protein, ur: NEGATIVE mg/dL
SPECIFIC GRAVITY, URINE: 1.02 (ref 1.005–1.030)
pH: 5.5 (ref 5.0–8.0)

## 2016-02-05 NOTE — MAU Note (Signed)
Pt c/o dry, cough and sore throat x2 days. Has some nausea as well. States husband has been sick recently. Denies vag bleeding and discharge. Denies contractions. +FM

## 2016-02-05 NOTE — MAU Note (Signed)
For two days I have had cough, headache, sore throat,neck pain. Some nausea. Husband had cough last wk.

## 2016-02-05 NOTE — Discharge Instructions (Signed)

## 2016-02-05 NOTE — MAU Provider Note (Signed)
MAU HISTORY AND PHYSICAL  Chief Complaint:  Sore Throat; Headache; Cough; and Nausea   Amanda [name] is a 30 y.o.  [id number] with IUP at [redacted]w[redacted]d presenting for Sore Throat; Headache; Cough; and Nausea.  She has hard sore throat, headache and cough for 2 days. She is here today because she is concerned about her baby. She says she just wanted make sure that the baby is not affected. She says baby has been moving as usual.  Cough is slightly productive with whitish phlegm. She denies hemoptysis, fever or chills. She has nausea today but denies emesis or diarrhea. She states she has been able to eat and drink as usual. She denies skin rash. She denies shortness of breath or chest pain. She denies myalgia or joint pain. Her husband has had similar symptoms a few days ago that has improved. He says he picked it from his colleague at work.  Patient denies abdominal pain, vaginal bleeding, vaginal discharge, dysuria, fever or chills or changes to fetal movement.  Past Medical History  Diagnosis Date  . Hypoglycemia   . Anemia   . Sickle cell trait [facility])     Past Surgical History  Procedure Laterality Date  . Hernia repair    . Umbilical hernia repair    . Cesarean section      FTP  . Dilation and curettage of uterus      Family History  Problem Relation Age of Onset  . Hyperlipidemia Mother   . Diabetes Father   . Hepatitis C Father   . Sickle cell anemia Father     Social History  Substance Use Topics  . Smoking status: Former Smoker    Quit date: 11/18/2009  . Smokeless tobacco: Never Used  . Alcohol Use: No    No Known Allergies  Prescriptions prior to admission  Medication Sig Dispense Refill Last Dose  . Prenatal Vit-Fe Fumarate-FA (PRENATAL VITAMINS PLUS PO) Take 1 tablet by mouth daily.   02/04/2016 at 2200  . terconazole (TERAZOL 7) 0.4 % vaginal cream Place 1 applicator vaginally at bedtime. 45 g 0 01/29/2016 at Unknown time  . valACYclovir (VALTREX) 1000 MG tablet TAKE 1  TABLET (1,000 MG TOTAL) BY MOUTH 2 (TWO) TIMES DAILY FOR 5 DAYS THEN 1 DAILY FOR 1 YEAR.   "OFFICE VISIT NEEDED FOR REFILLS" (Patient not taking: Reported on 01/11/2016) 30 tablet 0 Not Taking    Review of Systems - Negative except for what is mentioned in HPI.  Physical Exam  Blood pressure 112/60, pulse 115, temperature 99.2 F (37.3 C), resp. rate 18, height 5\' 4"  (1.626 m), weight 186 lb (84.369 kg), last menstrual period 06/29/2015, SpO2 98 %, unknown if currently breastfeeding. GENERAL: Well-developed, well-nourished female in no acute distress.  Oropharynx: Mucous membranes moist, no erythema or exudation Neck: Lymphadenopathy noted all over right anterior cervical triangle LUNGS: Clear to auscultation bilaterally.  HEART: Regular rate and rhythm. ABDOMEN: Soft, nontender, nondistended, gravid.  EXTREMITIES: Nontender, no edema, 2+ distal pulses. Skin: No apparent rash or lesion FHT: 130/reactive Contractions: None noted on toco   Labs: Results for orders placed or performed during the hospital encounter of 02/04/16 (from the past 24 hour(s))  Urinalysis, Routine w reflex microscopic (not at [facility])   Collection Time: 02/05/16 12:39 AM  Result Value Ref Range   Color, Urine YELLOW YELLOW   APPearance CLEAR CLEAR   Specific Gravity, Urine 1.020 1.005 - 1.030   pH 5.5 5.0 - 8.0   Glucose, UA NEGATIVE  NEGATIVE mg/dL   Hgb urine dipstick NEGATIVE NEGATIVE   Bilirubin Urine NEGATIVE NEGATIVE   Ketones, ur 15 (A) NEGATIVE mg/dL   Protein, ur NEGATIVE NEGATIVE mg/dL   Nitrite NEGATIVE NEGATIVE   Leukocytes, UA NEGATIVE NEGATIVE    Imaging Studies:  [place] [facility] Ob Follow Up  01/09/2016  OBSTETRICAL ULTRASOUND: This exam was performed within a [facility]. The OB US report was generated in the [facility] system, and faxed to the ordering physician.  This report is available in the [facility]. See the [facility] Obstetric US report via the Image Link.   Assessment: Amanda [name] is  30 y.o. G5P1031 at [redacted]w[redacted]d presents with symptoms suggestive for viral upper respiratory tract infection. She has one out of four Centor's criteria which makes strep pharyngitis unlikely. Doubt mononucleosis without exudation. Definitely not pneumonia result respiratory symptoms or chest pain. She has headache but no visual changes or right upper quadrant pain. Blood pressure within normal range as well as. Fetal well-being reassuring. Reassured patient that her baby is doing well. Explained to the patient that the viral infection might take days to weeks to resolve. Discussed return precautions.   Plan: Discharged home and follow-up as needed Patient has medication for nausea at home. Discussed return precautions Encouraged good hydration and nutrition Tylenol as needed for pain  Mercy Riding 6/10/20171:49 AM   I have participated in the care of this patient and I agree with the above. Serita Grammes CNM 8:59 AM 02/05/2016

## 2016-02-14 ENCOUNTER — Ambulatory Visit (INDEPENDENT_AMBULATORY_CARE_PROVIDER_SITE_OTHER): Payer: Medicaid Other | Admitting: Medical

## 2016-02-14 VITALS — BP 104/66 | HR 102 | Wt 183.0 lb

## 2016-02-14 DIAGNOSIS — Z3483 Encounter for supervision of other normal pregnancy, third trimester: Secondary | ICD-10-CM

## 2016-02-14 LAB — POCT URINALYSIS DIP (DEVICE)
Bilirubin Urine: NEGATIVE
GLUCOSE, UA: NEGATIVE mg/dL
Hgb urine dipstick: NEGATIVE
KETONES UR: NEGATIVE mg/dL
Nitrite: NEGATIVE
Protein, ur: NEGATIVE mg/dL
SPECIFIC GRAVITY, URINE: 1.015 (ref 1.005–1.030)
UROBILINOGEN UA: 2 mg/dL — AB (ref 0.0–1.0)
pH: 8.5 — ABNORMAL HIGH (ref 5.0–8.0)

## 2016-02-14 NOTE — Progress Notes (Signed)
Informed pt of the need for 3hr glucose test.  Pt stated that she will be able to come this week for testing.

## 2016-02-14 NOTE — Progress Notes (Signed)
Subjective:  Amanda [name] is a 30 y.o. G5P1031 at [redacted]w[redacted]d being seen today for ongoing prenatal care.  She is currently monitored for the following issues for this low-risk pregnancy and has HSV (herpes simplex virus) anogenital infection; History of cesarean delivery; Hemoglobin A-S genotype ([employer]); Supervision of normal pregnancy, antepartum; Pyelectasis of fetus on prenatal ultrasound; Anxiety state; and Depression on her problem list.  Patient reports backache and possible hemorrhoid. (patient refused exam to evaluate for hemorrhoids) Contractions: Not present. Vag. Bleeding: None.  Movement: Present. Denies leaking of fluid.   The following portions of the patient's history were reviewed and updated as appropriate: allergies, current medications, past family history, past medical history, past social history, past surgical history and problem list. Problem list updated.  Objective:   Filed Vitals:   02/14/16 1603  BP: 104/66  Pulse: 102  Weight: 183 lb (83.008 kg)    Fetal Status: Fetal Heart Rate (bpm): 149 Fundal Height: 32 cm Movement: Present     General:  Alert, oriented and cooperative. Patient is in no acute distress.  Skin: Skin is warm and dry. No rash noted.   Cardiovascular: Normal heart rate noted  Respiratory: Normal respiratory effort, no problems with respiration noted  Abdomen: Soft, gravid, appropriate for gestational age. Pain/Pressure: Present     Pelvic: Cervical exam deferred        Extremities: Normal range of motion.  Edema: None  Mental Status: Normal mood and affect. Normal behavior. Normal judgment and thought content.   Urinalysis: Urine Protein: Negative Urine Glucose: Negative  Assessment and Plan:  Pregnancy: [id number] at [redacted]w[redacted]d  1. Supervision of normal pregnancy, antepartum, third trimester - Discussed use of abdominal binder for back pain - Discussed increased PO hydration and dietary fiber for hemerrhoids, not painful currently  Preterm labor  symptoms and general obstetric precautions including but not limited to vaginal bleeding, contractions, leaking of fluid and fetal movement were reviewed in detail with the patient. Please refer to After Visit Summary for other counseling recommendations.  Return in about 2 weeks (around 02/28/2016) for LOB.   Luvenia Redden, PA-C

## 2016-02-14 NOTE — Patient Instructions (Signed)
Braxton Hicks Contractions Contractions of the uterus can occur throughout pregnancy. Contractions are not always a sign that you are in labor.  WHAT ARE BRAXTON HICKS CONTRACTIONS?  Contractions that occur before labor are called Braxton Hicks contractions, or false labor. Toward the end of pregnancy (32-34 weeks), these contractions can develop more often and may become more forceful. This is not true labor because these contractions do not result in opening (dilatation) and thinning of the cervix. They are sometimes difficult to tell apart from true labor because these contractions can be forceful and people have different pain tolerances. You should not feel embarrassed if you go to the hospital with false labor. Sometimes, the only way to tell if you are in true labor is for your health care provider to look for changes in the cervix. If there are no prenatal problems or other health problems associated with the pregnancy, it is completely safe to be sent home with false labor and await the onset of true labor. HOW CAN YOU TELL THE DIFFERENCE BETWEEN TRUE AND FALSE LABOR? False Labor  The contractions of false labor are usually shorter and not as hard as those of true labor.   The contractions are usually irregular.   The contractions are often felt in the front of the lower abdomen and in the groin.   The contractions may go away when you walk around or change positions while lying down.   The contractions get weaker and are shorter lasting as time goes on.   The contractions do not usually become progressively stronger, regular, and closer together as with true labor.  True Labor  Contractions in true labor last 30-70 seconds, become very regular, usually become more intense, and increase in frequency.   The contractions do not go away with walking.   The discomfort is usually felt in the top of the uterus and spreads to the lower abdomen and low back.   True labor can be  determined by your health care provider with an exam. This will show that the cervix is dilating and getting thinner.  WHAT TO REMEMBER  Keep up with your usual exercises and follow other instructions given by your health care provider.   Take medicines as directed by your health care provider.   Keep your regular prenatal appointments.   Eat and drink lightly if you think you are going into labor.   If Braxton Hicks contractions are making you uncomfortable:   Change your position from lying down or resting to walking, or from walking to resting.   Sit and rest in a tub of warm water.   Drink 2-3 glasses of water. Dehydration may cause these contractions.   Do slow and deep breathing several times an hour.  WHEN SHOULD I SEEK IMMEDIATE MEDICAL CARE? Seek immediate medical care if:  Your contractions become stronger, more regular, and closer together.   You have fluid leaking or gushing from your vagina.   You have a fever.   You pass blood-tinged mucus.   You have vaginal bleeding.   You have continuous abdominal pain.   You have low back pain that you never had before.   You feel your baby's head pushing down and causing pelvic pressure.   Your baby is not moving as much as it used to.    This information is not intended to replace advice given to you by your health care provider. Make sure you discuss any questions you have with your health care  provider.   Document Released: 08/14/2005 Document Revised: 08/19/2013 Document Reviewed: 05/26/2013 Elsevier Interactive Patient Education 2016 [place]. Fetal Movement Counts Patient Name: __________________________________________________ Patient Due Date: ____________________ Performing a fetal movement count is highly recommended in high-risk pregnancies, but it is good for every pregnant woman to do. Your health care provider may ask you to start counting fetal movements at 28 weeks of the  pregnancy. Fetal movements often increase:  After eating a full meal.  After physical activity.  After eating or drinking something sweet or cold.  At rest. Pay attention to when you feel the baby is most active. This will help you notice a pattern of your baby's sleep and wake cycles and what factors contribute to an increase in fetal movement. It is important to perform a fetal movement count at the same time each day when your baby is normally most active.  HOW TO COUNT FETAL MOVEMENTS  Find a quiet and comfortable area to sit or lie down on your left side. Lying on your left side provides the best blood and oxygen circulation to your baby.  Write down the day and time on a sheet of paper or in a journal.  Start counting kicks, flutters, swishes, rolls, or jabs in a 2-hour period. You should feel at least 10 movements within 2 hours.  If you do not feel 10 movements in 2 hours, wait 2-3 hours and count again. Look for a change in the pattern or not enough counts in 2 hours. SEEK MEDICAL CARE IF:  You feel less than 10 counts in 2 hours, tried twice.  There is no movement in over an hour.  The pattern is changing or taking longer each day to reach 10 counts in 2 hours.  You feel the baby is not moving as he or she usually does. Date: ____________ Movements: ____________ Start time: ____________ Elizebeth Koller time: ____________  Date: ____________ Movements: ____________ Start time: ____________ Elizebeth Koller time: ____________ Date: ____________ Movements: ____________ Start time: ____________ Elizebeth Koller time: ____________ Date: ____________ Movements: ____________ Start time: ____________ Elizebeth Koller time: ____________ Date: ____________ Movements: ____________ Start time: ____________ Elizebeth Koller time: ____________ Date: ____________ Movements: ____________ Start time: ____________ Elizebeth Koller time: ____________ Date: ____________ Movements: ____________ Start time: ____________ Elizebeth Koller time: ____________ Date:  ____________ Movements: ____________ Start time: ____________ Elizebeth Koller time: ____________  Date: ____________ Movements: ____________ Start time: ____________ Elizebeth Koller time: ____________ Date: ____________ Movements: ____________ Start time: ____________ Elizebeth Koller time: ____________ Date: ____________ Movements: ____________ Start time: ____________ Elizebeth Koller time: ____________ Date: ____________ Movements: ____________ Start time: ____________ Elizebeth Koller time: ____________ Date: ____________ Movements: ____________ Start time: ____________ Elizebeth Koller time: ____________ Date: ____________ Movements: ____________ Start time: ____________ Elizebeth Koller time: ____________ Date: ____________ Movements: ____________ Start time: ____________ Elizebeth Koller time: ____________  Date: ____________ Movements: ____________ Start time: ____________ Elizebeth Koller time: ____________ Date: ____________ Movements: ____________ Start time: ____________ Elizebeth Koller time: ____________ Date: ____________ Movements: ____________ Start time: ____________ Elizebeth Koller time: ____________ Date: ____________ Movements: ____________ Start time: ____________ Elizebeth Koller time: ____________ Date: ____________ Movements: ____________ Start time: ____________ Elizebeth Koller time: ____________ Date: ____________ Movements: ____________ Start time: ____________ Elizebeth Koller time: ____________ Date: ____________ Movements: ____________ Start time: ____________ Elizebeth Koller time: ____________  Date: ____________ Movements: ____________ Start time: ____________ Elizebeth Koller time: ____________ Date: ____________ Movements: ____________ Start time: ____________ Elizebeth Koller time: ____________ Date: ____________ Movements: ____________ Start time: ____________ Elizebeth Koller time: ____________ Date: ____________ Movements: ____________ Start time: ____________ Elizebeth Koller time: ____________ Date: ____________ Movements: ____________ Start time: ____________ Elizebeth Koller time: ____________ Date: ____________ Movements: ____________ Start  time: ____________ Elizebeth Koller time:  ____________ Date: ____________ Movements: ____________ Start time: ____________ Elizebeth Koller time: ____________  Date: ____________ Movements: ____________ Start time: ____________ Elizebeth Koller time: ____________ Date: ____________ Movements: ____________ Start time: ____________ Elizebeth Koller time: ____________ Date: ____________ Movements: ____________ Start time: ____________ Elizebeth Koller time: ____________ Date: ____________ Movements: ____________ Start time: ____________ Elizebeth Koller time: ____________ Date: ____________ Movements: ____________ Start time: ____________ Elizebeth Koller time: ____________ Date: ____________ Movements: ____________ Start time: ____________ Elizebeth Koller time: ____________ Date: ____________ Movements: ____________ Start time: ____________ Elizebeth Koller time: ____________  Date: ____________ Movements: ____________ Start time: ____________ Elizebeth Koller time: ____________ Date: ____________ Movements: ____________ Start time: ____________ Elizebeth Koller time: ____________ Date: ____________ Movements: ____________ Start time: ____________ Elizebeth Koller time: ____________ Date: ____________ Movements: ____________ Start time: ____________ Elizebeth Koller time: ____________ Date: ____________ Movements: ____________ Start time: ____________ Elizebeth Koller time: ____________ Date: ____________ Movements: ____________ Start time: ____________ Elizebeth Koller time: ____________ Date: ____________ Movements: ____________ Start time: ____________ Elizebeth Koller time: ____________  Date: ____________ Movements: ____________ Start time: ____________ Elizebeth Koller time: ____________ Date: ____________ Movements: ____________ Start time: ____________ Elizebeth Koller time: ____________ Date: ____________ Movements: ____________ Start time: ____________ Elizebeth Koller time: ____________ Date: ____________ Movements: ____________ Start time: ____________ Elizebeth Koller time: ____________ Date: ____________ Movements: ____________ Start time: ____________ Elizebeth Koller time:  ____________ Date: ____________ Movements: ____________ Start time: ____________ Elizebeth Koller time: ____________ Date: ____________ Movements: ____________ Start time: ____________ Elizebeth Koller time: ____________  Date: ____________ Movements: ____________ Start time: ____________ Elizebeth Koller time: ____________ Date: ____________ Movements: ____________ Start time: ____________ Elizebeth Koller time: ____________ Date: ____________ Movements: ____________ Start time: ____________ Elizebeth Koller time: ____________ Date: ____________ Movements: ____________ Start time: ____________ Elizebeth Koller time: ____________ Date: ____________ Movements: ____________ Start time: ____________ Elizebeth Koller time: ____________ Date: ____________ Movements: ____________ Start time: ____________ Elizebeth Koller time: ____________   This information is not intended to replace advice given to you by your health care provider. Make sure you discuss any questions you have with your health care provider.   Document Released: 09/13/2006 Document Revised: 09/04/2014 Document Reviewed: 06/10/2012 Elsevier Interactive Patient Education [employer].

## 2016-02-18 ENCOUNTER — Other Ambulatory Visit (HOSPITAL_COMMUNITY): Payer: Self-pay | Admitting: Maternal and Fetal Medicine

## 2016-02-18 ENCOUNTER — Encounter (HOSPITAL_COMMUNITY): Payer: Self-pay

## 2016-02-18 ENCOUNTER — Ambulatory Visit (HOSPITAL_COMMUNITY)
Admission: RE | Admit: 2016-02-18 | Discharge: 2016-02-18 | Disposition: A | Discharge status: Home / Self Care | Payer: Medicaid Other | Source: Ambulatory Visit | Attending: Family | Admitting: Family

## 2016-02-18 VITALS — BP 102/62 | HR 115 | Wt 186.1 lb

## 2016-02-18 DIAGNOSIS — Z862 Personal history of diseases of the blood and blood-forming organs and certain disorders involving the immune mechanism: Secondary | ICD-10-CM | POA: Insufficient documentation

## 2016-02-18 DIAGNOSIS — O359XX0 Maternal care for (suspected) fetal abnormality and damage, unspecified, not applicable or unspecified: Secondary | ICD-10-CM | POA: Insufficient documentation

## 2016-02-18 DIAGNOSIS — O35EXX0 Maternal care for other (suspected) fetal abnormality and damage, fetal genitourinary anomalies, not applicable or unspecified: Secondary | ICD-10-CM

## 2016-02-18 DIAGNOSIS — O358XX0 Maternal care for other (suspected) fetal abnormality and damage, not applicable or unspecified: Secondary | ICD-10-CM

## 2016-02-18 DIAGNOSIS — Z3A33 33 weeks gestation of pregnancy: Secondary | ICD-10-CM

## 2016-02-18 DIAGNOSIS — Q620 Congenital hydronephrosis: Secondary | ICD-10-CM | POA: Insufficient documentation

## 2016-02-18 DIAGNOSIS — Z98891 History of uterine scar from previous surgery: Secondary | ICD-10-CM

## 2016-02-18 DIAGNOSIS — O34219 Maternal care for unspecified type scar from previous cesarean delivery: Secondary | ICD-10-CM

## 2016-02-18 DIAGNOSIS — Z36 Encounter for antenatal screening of mother: Secondary | ICD-10-CM | POA: Insufficient documentation

## 2016-02-18 DIAGNOSIS — O283 Abnormal ultrasonic finding on antenatal screening of mother: Secondary | ICD-10-CM | POA: Insufficient documentation

## 2016-02-18 DIAGNOSIS — Z3483 Encounter for supervision of other normal pregnancy, third trimester: Secondary | ICD-10-CM

## 2016-02-21 ENCOUNTER — Other Ambulatory Visit: Payer: Medicaid Other

## 2016-02-21 DIAGNOSIS — R7309 Other abnormal glucose: Secondary | ICD-10-CM

## 2016-02-22 LAB — GLUCOSE TOLERANCE, 3 HOURS
GLUCOSE 3 HOUR GTT: 139 mg/dL (ref <145)
GLUCOSE, 1 HOUR-GESTATIONAL: 190 mg/dL — AB (ref <190)
GLUCOSE, 2 HOUR-GESTATIONAL: 184 mg/dL — AB (ref <165)
Glucose Tolerance, Fasting: 83 mg/dL (ref 65–104)

## 2016-02-28 ENCOUNTER — Encounter: Payer: Medicaid Other | Attending: Obstetrics & Gynecology | Admitting: Dietician

## 2016-02-28 ENCOUNTER — Ambulatory Visit: Payer: Medicaid Other | Admitting: *Deleted

## 2016-02-28 DIAGNOSIS — Z029 Encounter for administrative examinations, unspecified: Secondary | ICD-10-CM | POA: Insufficient documentation

## 2016-02-28 DIAGNOSIS — O24410 Gestational diabetes mellitus in pregnancy, diet controlled: Secondary | ICD-10-CM

## 2016-02-28 MED ORDER — GLUCOSE BLOOD VI STRP: ORAL_STRIP | Status: DC

## 2016-02-28 MED ORDER — ACCU-CHEK FASTCLIX LANCETS MISC: 1.0000 | Freq: Four times a day (QID) | Status: DC

## 2016-02-28 MED ORDER — ACCU-CHEK AVIVA PLUS W/DEVICE KIT: 1.0000 | PACK | Freq: Once | Status: DC

## 2016-02-28 NOTE — Progress Notes (Signed)
Diabetes/Nutrition Education:02/28/16 Amanda [name] is currently 35 weeks tomorrow.  Has no previous hx of GDM but does have a positive family hx of Type 2 DM. EDC is 04/04/16. Provided Handout "Nutrition, Diabetes, and Pregnancy".   Completed review of factors affecting glucose in pregnancy and the goals for controlling blood glucose in pregnancy. Completed review of the self-care measures to use following delivery of the baby and preventing developing Type 2 DM later in life. Advised to walk 30 minutes in cooler part of the day for 7 days a week.  If to hot and humid, consider walking in place in the home or go to a big box store and push the buggy through the store and not shop. Completed review of blood glucose monitoring and the blood glucose goals. Instructed to monitor fasting and 2 hour post meal glucose levels.  Record glucose levels and bring meter and glucose log to all clinic appointments. Insurance is pregnancy Medicaid. Nursing will send in electronic prescription for the Accu-Chek Aviva Plus meter along with the Softclix lancets, and strips to the [facility] on [address]. Completed review of the carbohydrate restricted diet for GDM along with carb counting, label reading and meal/menu suggestions. Using my demonstration meter and disposable lancet, her glucose level was 123 mg/dl at 3 hours past her last meal. Will plan to follow as needed.

## 2016-03-01 ENCOUNTER — Encounter: Payer: Medicaid Other | Admitting: Obstetrics and Gynecology

## 2016-03-06 ENCOUNTER — Other Ambulatory Visit (HOSPITAL_COMMUNITY)
Admission: RE | Admit: 2016-03-06 | Discharge: 2016-03-06 | Disposition: A | Discharge status: Home / Self Care | Payer: Medicaid Other | Source: Ambulatory Visit | Attending: Obstetrics and Gynecology | Admitting: Obstetrics and Gynecology

## 2016-03-06 ENCOUNTER — Ambulatory Visit (INDEPENDENT_AMBULATORY_CARE_PROVIDER_SITE_OTHER): Payer: Medicaid Other | Admitting: Obstetrics and Gynecology

## 2016-03-06 VITALS — BP 103/61 | HR 104 | Wt 186.3 lb

## 2016-03-06 DIAGNOSIS — O99013 Anemia complicating pregnancy, third trimester: Secondary | ICD-10-CM | POA: Diagnosis not present

## 2016-03-06 DIAGNOSIS — D126 Benign neoplasm of colon, unspecified: Secondary | ICD-10-CM | POA: Insufficient documentation

## 2016-03-06 DIAGNOSIS — O98313 Other infections with a predominantly sexual mode of transmission complicating pregnancy, third trimester: Secondary | ICD-10-CM

## 2016-03-06 DIAGNOSIS — D573 Sickle-cell trait: Secondary | ICD-10-CM

## 2016-03-06 DIAGNOSIS — Z113 Encounter for screening for infections with a predominantly sexual mode of transmission: Secondary | ICD-10-CM | POA: Diagnosis not present

## 2016-03-06 DIAGNOSIS — Z98891 History of uterine scar from previous surgery: Secondary | ICD-10-CM | POA: Diagnosis not present

## 2016-03-06 DIAGNOSIS — A6009 Herpesviral infection of other urogenital tract: Secondary | ICD-10-CM | POA: Diagnosis not present

## 2016-03-06 DIAGNOSIS — A609 Anogenital herpesviral infection, unspecified: Secondary | ICD-10-CM

## 2016-03-06 DIAGNOSIS — Z8632 Personal history of gestational diabetes: Secondary | ICD-10-CM | POA: Insufficient documentation

## 2016-03-06 DIAGNOSIS — O0993 Supervision of high risk pregnancy, unspecified, third trimester: Secondary | ICD-10-CM

## 2016-03-06 DIAGNOSIS — O24410 Gestational diabetes mellitus in pregnancy, diet controlled: Secondary | ICD-10-CM

## 2016-03-06 NOTE — Addendum Note (Signed)
Addended by: Phillip Heal, Yaire Kreher A on: 03/06/2016 12:02 PM   Modules accepted: Orders

## 2016-03-06 NOTE — Addendum Note (Signed)
Addended by: Shelly Coss on: 03/06/2016 05:04 PM   Modules accepted: Orders

## 2016-03-06 NOTE — Progress Notes (Signed)
Subjective:  Amanda [name] is a 30 y.o. G5P1031 at [redacted]w[redacted]d being seen today for ongoing prenatal care.  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  Patient reports no complaints.   Contractions: Not present.  .  Movement: Present. Denies leaking of fluid.   The following portions of the patient's history were reviewed and updated as appropriate: allergies, current medications, past family history, past medical history, past social history, past surgical history and problem list. Problem list updated.  Objective:   Filed Vitals:   03/06/16 0925  BP: 103/61  Pulse: 104  Weight: 186 lb 4.8 oz (84.505 kg)    Fetal Status: Fetal Heart Rate (bpm): 144   Movement: Present  Presentation: Vertex  General:  Alert, oriented and cooperative. Patient is in no acute distress.  Skin: Skin is warm and dry. No rash noted.   Cardiovascular: Normal heart rate noted  Respiratory: Normal respiratory effort, no problems with respiration noted  Abdomen: Soft, gravid, appropriate for gestational age. Pain/Pressure: Present     Pelvic:  Cervical exam deferred        Extremities: Normal range of motion.  Edema: None  Mental Status: Normal mood and affect. Normal behavior. Normal judgment and thought content.   Urinalysis: Urine Protein: Negative Urine Glucose: Negative  Assessment and Plan:  Pregnancy: G5P1031 at [redacted]w[redacted]d  1. Diet controlled gestational diabetes mellitus in third trimester BS log normal with a few 2hr PP in the 130s-140s but rare values Continue with diet control Had growth scan for f/u fetal kidneys (normal) on 6/23 (see below)   2. History of cesarean delivery c-section for 7lbs 10oz infant at [facility] in  [place]. Patient states that she pushed for at least an hour and was then told she had an infection. i d/w her her that given her history, that I wouldn't recommend TOLAC given what sounds like 2nd stage arrest for a normal sized infant. Risk of shoulder dystocia and sequelae and risk of another 2nd stage arrest c-section with risks associated with that but she would still like to tolac. Can f/u at [facility] and order another u/s PRN  3. HSV (herpes simplex virus) anogenital infection Continue med ppx  4. 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 Supervision of high risk pregnancy in third trimester GBS, GC/CT today  Preterm labor symptoms and general obstetric precautions including but not limited to vaginal bleeding, contractions, leaking of fluid and fetal movement were reviewed in detail with the patient. Please refer to After Visit Summary for other counseling recommendations.   RTC: 1wk  Aletha Halim, MD

## 2016-03-06 NOTE — Progress Notes (Signed)
6/23 u/s: 3072gm, EFW>90%, [id number], AFI 12, cephalic.

## 2016-03-07 LAB — GC/CHLAMYDIA PROBE AMP (~~LOC~~) NOT AT ARMC
Chlamydia: NEGATIVE
Neisseria Gonorrhea: NEGATIVE

## 2016-03-08 LAB — CULTURE, BETA STREP (GROUP B ONLY)

## 2016-03-14 ENCOUNTER — Encounter: Payer: Medicaid Other | Admitting: Family

## 2016-03-15 ENCOUNTER — Ambulatory Visit (INDEPENDENT_AMBULATORY_CARE_PROVIDER_SITE_OTHER): Payer: Medicaid Other | Admitting: Advanced Practice Midwife

## 2016-03-15 VITALS — BP 99/65 | HR 102 | Wt 184.0 lb

## 2016-03-15 DIAGNOSIS — O3662X1 Maternal care for excessive fetal growth, second trimester, fetus 1: Secondary | ICD-10-CM

## 2016-03-15 DIAGNOSIS — A6009 Herpesviral infection of other urogenital tract: Secondary | ICD-10-CM | POA: Diagnosis not present

## 2016-03-15 DIAGNOSIS — F418 Other specified anxiety disorders: Secondary | ICD-10-CM | POA: Diagnosis not present

## 2016-03-15 DIAGNOSIS — O98313 Other infections with a predominantly sexual mode of transmission complicating pregnancy, third trimester: Secondary | ICD-10-CM | POA: Diagnosis not present

## 2016-03-15 DIAGNOSIS — O3662X0 Maternal care for excessive fetal growth, second trimester, not applicable or unspecified: Secondary | ICD-10-CM | POA: Insufficient documentation

## 2016-03-15 DIAGNOSIS — O34219 Maternal care for unspecified type scar from previous cesarean delivery: Secondary | ICD-10-CM | POA: Diagnosis not present

## 2016-03-15 DIAGNOSIS — O99343 Other mental disorders complicating pregnancy, third trimester: Secondary | ICD-10-CM

## 2016-03-15 DIAGNOSIS — O24419 Gestational diabetes mellitus in pregnancy, unspecified control: Secondary | ICD-10-CM

## 2016-03-15 DIAGNOSIS — Z3A39 39 weeks gestation of pregnancy: Secondary | ICD-10-CM

## 2016-03-15 DIAGNOSIS — O0993 Supervision of high risk pregnancy, unspecified, third trimester: Secondary | ICD-10-CM

## 2016-03-15 DIAGNOSIS — Z1389 Encounter for screening for other disorder: Secondary | ICD-10-CM

## 2016-03-15 DIAGNOSIS — O24410 Gestational diabetes mellitus in pregnancy, diet controlled: Secondary | ICD-10-CM

## 2016-03-15 LAB — POCT URINALYSIS DIP (DEVICE)
BILIRUBIN URINE: NEGATIVE
GLUCOSE, UA: NEGATIVE mg/dL
Hgb urine dipstick: NEGATIVE
Ketones, ur: 15 mg/dL — AB
LEUKOCYTES UA: NEGATIVE
NITRITE: NEGATIVE
Protein, ur: NEGATIVE mg/dL
SPECIFIC GRAVITY, URINE: 1.015 (ref 1.005–1.030)
UROBILINOGEN UA: 0.2 mg/dL (ref 0.0–1.0)
pH: 5.5 (ref 5.0–8.0)

## 2016-03-15 NOTE — Patient Instructions (Signed)
Trial of Labor After Cesarean Delivery Information A trial of labor after cesarean delivery (TOLAC) is when a woman tries to give birth vaginally after a previous cesarean delivery. TOLAC may be a safe and appropriate option for you depending on your medical history and other risk factors. When TOLAC is successful and you are able to have a vaginal delivery, this is called a vaginal birth after cesarean delivery (VBAC).  CANDIDATES FOR TOLAC TOLAC is possible for some women who:  Have undergone one or two prior cesarean deliveries in which the incision of the uterus was horizontal (low transverse).  Are carrying twins and have had one prior low transverse incision during a cesarean delivery.  Do not have a vertical (classical) uterine scar.  Have not had a tear in the wall of their uterus (uterine rupture). TOLAC is also supported for women who meet appropriate criteria and:  Are under the age of 40 years.  Are tall and have a body mass index (BMI) of less than 30.  Have an unknown uterine scar.  Give birth in a facility equipped to handle an emergency cesarean delivery. This team should be able to handle possible complications such as a uterine rupture.  Have thorough counseling about the benefits and risks of TOLAC.  Have discussed future pregnancy plans with their health care provider.  Plan to have several more pregnancies. MOST SUCCESSFUL CANDIDATES FOR TOLAC:  Have had a successful vaginal delivery before or after their cesarean delivery.  Experience labor that begins naturally on or before the due date (40 weeks of gestation).  Do not have a very large (macrosomic) baby.   Had a prior cesarean delivery but are not currently experiencing factors that would prompt a cesarean delivery (such as a breech position).  Had only one prior cesarean delivery.  Had a prior cesarean delivery that was performed early in labor and not after full cervical dilation. TOLAC may be most  appropriate for women who meet the above guidelines and who plan to have more pregnancies. TOLAC is not recommended for home births. LEAST SUCCESSFUL CANDIDATES FOR TOLAC:  Have an induced labor with an unfavorable cervix. An unfavorable cervix is when the cervix is not dilating enough (among other factors).  Have never had a vaginal delivery.  Have had more than two cesarean deliveries.  Have a pregnancy at more than 40 weeks of gestation.  Are pregnant with a baby with a suspected weight greater than 4,000 grams (8 pounds) and who have no prior history of a vaginal delivery.  Have closely spaced pregnancies. SUGGESTED BENEFITS OF TOLAC  You may have a faster recovery time.  You may have a shorter stay in the hospital.  You may have less pain and fewer problems than with a cesarean delivery. Women who have a cesarean delivery have a higher chance of needing blood or getting a fever, an infection, or a blood clot in the legs. SUGGESTED RISKS OF TOLAC The highest risk of complications happens to women who attempt a TOLAC and fail. A failed TOLAC results in an unplanned cesarean delivery. Risks related to TOLAC or repeat cesarean deliveries include:   Blood loss.  Infection.  Blood clot.  Injury to surrounding tissues or organs.  Having to remove the uterus (hysterectomy).  Potential problems with the placenta (such as placenta previa or placenta accreta) in future pregnancies. Although very rare, the main concerns with TOLAC are:  Rupture of the uterine scar from a past cesarean delivery.  Needing an   emergency cesarean delivery.  Having a bad outcome for the baby (perinatal morbidity). FOR MORE INFORMATION [employer] of Obstetricians and Gynec[employer]: www.acog.[place]: www.midwife.org   This information is not intended to replace advice given to you by your health care provider. Make sure you discuss any questions you have with  your health care provider.   Document Released: 05/02/2011 Document Revised: 06/04/2013 Document Reviewed: 02/03/2013 Elsevier Interactive Patient Education [employer].

## 2016-03-15 NOTE — Progress Notes (Signed)
Breastfeeding discussed with patient

## 2016-03-15 NOTE — Progress Notes (Signed)
Subjective:  Amanda [name] is a 30 y.o. G5P1031 at [redacted]w[redacted]d being seen today for ongoing prenatal care.  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  Patient reports no complaints.  Contractions: Irritability. Vag. Bleeding: None.  Movement: Present. Denies leaking of fluid.   The following portions of the patient's history were reviewed and updated as appropriate: allergies, current medications, past family history, past medical history, past social history, past surgical history and problem list. Problem list updated.  Objective:   Filed Vitals:   03/15/16 0843  BP: 99/65  Pulse: 102  Weight: 184 lb (83.462 kg)    Fetal Status: Fetal Heart Rate (bpm): 138   Movement: Present     General:  Alert, oriented and cooperative. Patient is in no acute distress.  Skin: Skin is warm and dry. No rash noted.   Cardiovascular: Normal heart rate noted  Respiratory: Normal respiratory effort, no problems with respiration noted  Abdomen: Soft, gravid, appropriate for gestational age. Pain/Pressure: Present     Pelvic:  Cervical exam deferred        Extremities: Normal range of motion.  Edema: None  Mental Status: Normal mood and affect. Normal behavior. Normal judgment and thought content.   Urinalysis:    prot: negative, Glucose negative  Assessment and Plan:  Pregnancy: [id number] at [redacted]w[redacted]d  1. Diet controlled gestational diabetes mellitus in third trimester Pt reports DM under good control fastings between 80-88 and post prandial between 90 and 115. Schedule growth [place]. Pt still plans for TOLAC. Plan for induction at 40 wks if no SOL.  - [place] MFM OB FOLLOW UP; Future  2.  Supervision of high risk pregnancy in third trimester Follow up 1 wk.  - [place] MFM OB FOLLOW UP; Future   5. Fetal macrosomia during pregnancy in second trimester, fetus 1 EFW>90% at last [place] around 35 wks. Will repeat at this time however to reasses fetal size given desire for TOLAC.  - [place] MFM OB FOLLOW UP; Future  Term labor symptoms and general obstetric precautions including but not limited to vaginal bleeding, contractions, leaking of fluid and fetal movement were reviewed in detail with the patient. Please refer to After Visit Summary for other counseling recommendations.  Return in about 1 week (around 03/22/2016) for HROB.   Waldemar Dickens, MD

## 2016-03-23 ENCOUNTER — Ambulatory Visit (INDEPENDENT_AMBULATORY_CARE_PROVIDER_SITE_OTHER): Payer: Medicaid Other | Admitting: Family Medicine

## 2016-03-23 VITALS — BP 100/64 | HR 107 | Wt 184.4 lb

## 2016-03-23 DIAGNOSIS — O24410 Gestational diabetes mellitus in pregnancy, diet controlled: Secondary | ICD-10-CM

## 2016-03-23 DIAGNOSIS — O34219 Maternal care for unspecified type scar from previous cesarean delivery: Secondary | ICD-10-CM | POA: Diagnosis not present

## 2016-03-23 DIAGNOSIS — O0993 Supervision of high risk pregnancy, unspecified, third trimester: Secondary | ICD-10-CM

## 2016-03-23 DIAGNOSIS — R319 Hematuria, unspecified: Secondary | ICD-10-CM | POA: Diagnosis not present

## 2016-03-23 LAB — POCT URINALYSIS DIP (DEVICE)
Bilirubin Urine: NEGATIVE
Glucose, UA: NEGATIVE mg/dL
NITRITE: NEGATIVE
PH: 5.5 (ref 5.0–8.0)
Protein, ur: NEGATIVE mg/dL
Specific Gravity, Urine: 1.01 (ref 1.005–1.030)
UROBILINOGEN UA: 0.2 mg/dL (ref 0.0–1.0)

## 2016-03-23 NOTE — Progress Notes (Signed)
Urine: moderate blood, small leukocytes

## 2016-03-23 NOTE — Patient Instructions (Signed)
Breastfeeding Deciding to breastfeed is one of the best choices you can make for you and your baby. A change in hormones during pregnancy causes your breast tissue to grow and increases the number and size of your milk ducts. These hormones also allow proteins, sugars, and fats from your blood supply to make breast milk in your milk-producing glands. Hormones prevent breast milk from being released before your baby is born as well as prompt milk flow after birth. Once breastfeeding has begun, thoughts of your baby, as well as his or her sucking or crying, can stimulate the release of milk from your milk-producing glands.  BENEFITS OF BREASTFEEDING For Your Baby  Your first milk (colostrum) helps your baby's digestive system function better.  There are antibodies in your milk that help your baby fight off infections.  Your baby has a lower incidence of asthma, allergies, and sudden infant death syndrome.  The nutrients in breast milk are better for your baby than infant formulas and are designed uniquely for your baby's needs.  Breast milk improves your baby's brain development.  Your baby is less likely to develop other conditions, such as childhood obesity, asthma, or type 2 diabetes mellitus. For You  Breastfeeding helps to create a very special bond between you and your baby.  Breastfeeding is convenient. Breast milk is always available at the correct temperature and costs nothing.  Breastfeeding helps to burn calories and helps you lose the weight gained during pregnancy.  Breastfeeding makes your uterus contract to its prepregnancy size faster and slows bleeding (lochia) after you give birth.   Breastfeeding helps to lower your risk of developing type 2 diabetes mellitus, osteoporosis, and breast or ovarian cancer later in life. SIGNS THAT YOUR BABY IS HUNGRY Early Signs of Hunger  Increased alertness or activity.  Stretching.  Movement of the head from side to  side.  Movement of the head and opening of the mouth when the corner of the mouth or cheek is stroked (rooting).  Increased sucking sounds, smacking lips, cooing, sighing, or squeaking.  Hand-to-mouth movements.  Increased sucking of fingers or hands. Late Signs of Hunger  Fussing.  Intermittent crying. Extreme Signs of Hunger Signs of extreme hunger will require calming and consoling before your baby will be able to breastfeed successfully. Do not wait for the following signs of extreme hunger to occur before you initiate breastfeeding:  Restlessness.  A loud, strong cry.  Screaming. BREASTFEEDING BASICS Breastfeeding Initiation  Find a comfortable place to sit or lie down, with your neck and back well supported.  Place a pillow or rolled up blanket under your baby to bring him or her to the level of your breast (if you are seated). Nursing pillows are specially designed to help support your arms and your baby while you breastfeed.  Make sure that your baby's abdomen is facing your abdomen.  Gently massage your breast. With your fingertips, massage from your chest wall toward your nipple in a circular motion. This encourages milk flow. You may need to continue this action during the feeding if your milk flows slowly.  Support your breast with 4 fingers underneath and your thumb above your nipple. Make sure your fingers are well away from your nipple and your baby's mouth.  Stroke your baby's lips gently with your finger or nipple.  When your baby's mouth is open wide enough, quickly bring your baby to your breast, placing your entire nipple and as much of the colored area around your  nipple (areola) as possible into your baby's mouth.  More areola should be visible above your baby's upper lip than below the lower lip.  Your baby's tongue should be between his or her lower gum and your breast.  Ensure that your baby's mouth is correctly positioned around your nipple  (latched). Your baby's lips should create a seal on your breast and be turned out (everted).  It is common for your baby to suck about 2-3 minutes in order to start the flow of breast milk. Latching Teaching your baby how to latch on to your breast properly is very important. An improper latch can cause nipple pain and decreased milk supply for you and poor weight gain in your baby. Also, if your baby is not latched onto your nipple properly, he or she may swallow some air during feeding. This can make your baby fussy. Burping your baby when you switch breasts during the feeding can help to get rid of the air. However, teaching your baby to latch on properly is still the best way to prevent fussiness from swallowing air while breastfeeding. Signs that your baby has successfully latched on to your nipple:  Silent tugging or silent sucking, without causing you pain.  Swallowing heard between every 3-4 sucks.  Muscle movement above and in front of his or her ears while sucking. Signs that your baby has not successfully latched on to nipple:  Sucking sounds or smacking sounds from your baby while breastfeeding.  Nipple pain. If you think your baby has not latched on correctly, slip your finger into the corner of your baby's mouth to break the suction and place it between your baby's gums. Attempt breastfeeding initiation again. Signs of Successful Breastfeeding Signs from your baby:  A gradual decrease in the number of sucks or complete cessation of sucking.  Falling asleep.  Relaxation of his or her body.  Retention of a small amount of milk in his or her mouth.  Letting go of your breast by himself or herself. Signs from you:  Breasts that have increased in firmness, weight, and size 1-3 hours after feeding.  Breasts that are softer immediately after breastfeeding.  Increased milk volume, as well as a change in milk consistency and color by the fifth day of breastfeeding.  Nipples  that are not sore, cracked, or bleeding. Signs That Your [employer] is Getting Enough Milk  Wetting at least 3 diapers in a 24-hour period. The urine should be clear and pale yellow by age 32 days.  At least 3 stools in a 24-hour period by age 32 days. The stool should be soft and yellow.  At least 3 stools in a 24-hour period by age 23 days. The stool should be seedy and yellow.  No loss of weight greater than 10% of birth weight during the first 3 days of age.  Average weight gain of 4-7 ounces (113-198 g) per week after age 19 days.  Consistent daily weight gain by age 19 days, without weight loss after the age of 2 weeks. After a feeding, your baby may spit up a small amount. This is common. BREASTFEEDING FREQUENCY AND DURATION Frequent feeding will help you make more milk and can prevent sore nipples and breast engorgement. Breastfeed when you feel the need to reduce the fullness of your breasts or when your baby shows signs of hunger. This is called "breastfeeding on demand." Avoid introducing a pacifier to your baby while you are working to establish breastfeeding (the first 4-6  weeks after your baby is born). After this time you may choose to use a pacifier. Research has shown that pacifier use during the first year of a baby's life decreases the risk of sudden infant death syndrome (SIDS). Allow your baby to feed on each breast as long as he or she wants. Breastfeed until your baby is finished feeding. When your baby unlatches or falls asleep while feeding from the first breast, offer the second breast. Because newborns are often sleepy in the first few weeks of life, you may need to awaken your baby to get him or her to feed. Breastfeeding times will vary from baby to baby. However, the following rules can serve as a guide to help you ensure that your baby is properly fed:  Newborns (babies 25 weeks of age or younger) may breastfeed every 1-3 hours.  Newborns should not go longer than 3 hours  during the day or 5 hours during the night without breastfeeding.  You should breastfeed your baby a minimum of 8 times in a 24-hour period until you begin to introduce solid foods to your baby at around 60 months of age. BREAST MILK PUMPING Pumping and storing breast milk allows you to ensure that your baby is exclusively fed your breast milk, even at times when you are unable to breastfeed. This is especially important if you are going back to work while you are still breastfeeding or when you are not able to be present during feedings. Your lactation consultant can give you guidelines on how long it is safe to store breast milk. A breast pump is a machine that allows you to pump milk from your breast into a sterile bottle. The pumped breast milk can then be stored in a refrigerator or freezer. Some breast pumps are operated by hand, while others use electricity. Ask your lactation consultant which type will work best for you. Breast pumps can be purchased, but some hospitals and breastfeeding support groups lease breast pumps on a monthly basis. A lactation consultant can teach you how to hand express breast milk, if you prefer not to use a pump. CARING FOR YOUR BREASTS WHILE YOU BREASTFEED Nipples can become dry, cracked, and sore while breastfeeding. The following recommendations can help keep your breasts moisturized and healthy:  Avoid using soap on your nipples.  Wear a supportive bra. Although not required, special nursing bras and tank tops are designed to allow access to your breasts for breastfeeding without taking off your entire bra or top. Avoid wearing underwire-style bras or extremely tight bras.  Air dry your nipples for 3-34minutes after each feeding.  Use only cotton bra pads to absorb leaked breast milk. Leaking of breast milk between feedings is normal.  Use lanolin on your nipples after breastfeeding. Lanolin helps to maintain your skin's normal moisture barrier. If you use  pure lanolin, you do not need to wash it off before feeding your baby again. Pure lanolin is not toxic to your baby. You may also hand express a few drops of breast milk and gently massage that milk into your nipples and allow the milk to air dry. In the first few weeks after giving birth, some women experience extremely full breasts (engorgement). Engorgement can make your breasts feel heavy, warm, and tender to the touch. Engorgement peaks within 3-5 days after you give birth. The following recommendations can help ease engorgement:  Completely empty your breasts while breastfeeding or pumping. You may want to start by applying warm, moist heat (  in the shower or with warm water-soaked hand towels) just before feeding or pumping. This increases circulation and helps the milk flow. If your baby does not completely empty your breasts while breastfeeding, pump any extra milk after he or she is finished.  Wear a snug bra (nursing or regular) or tank top for 1-2 days to signal your body to slightly decrease milk production.  Apply ice packs to your breasts, unless this is too uncomfortable for you.  Make sure that your baby is latched on and positioned properly while breastfeeding. If engorgement persists after 48 hours of following these recommendations, contact your health care provider or a Science writer. OVERALL HEALTH CARE RECOMMENDATIONS WHILE BREASTFEEDING  Eat healthy foods. Alternate between meals and snacks, eating 3 of each per day. Because what you eat affects your breast milk, some of the foods may make your baby more irritable than usual. Avoid eating these foods if you are sure that they are negatively affecting your baby.  Drink milk, fruit juice, and water to satisfy your thirst (about 10 glasses a day).  Rest often, relax, and continue to take your prenatal vitamins to prevent fatigue, stress, and anemia.  Continue breast self-awareness checks.  Avoid chewing and smoking  tobacco. Chemicals from cigarettes that pass into breast milk and exposure to secondhand smoke may harm your baby.  Avoid alcohol and drug use, including marijuana. Some medicines that may be harmful to your baby can pass through breast milk. It is important to ask your health care provider before taking any medicine, including all over-the-counter and prescription medicine as well as vitamin and herbal supplements. It is possible to become pregnant while breastfeeding. If birth control is desired, ask your health care provider about options that will be safe for your baby. SEEK MEDICAL CARE IF:  You feel like you want to stop breastfeeding or have become frustrated with breastfeeding.  You have painful breasts or nipples.  Your nipples are cracked or bleeding.  Your breasts are red, tender, or warm.  You have a swollen area on either breast.  You have a fever or chills.  You have nausea or vomiting.  You have drainage other than breast milk from your nipples.  Your breasts do not become full before feedings by the fifth day after you give birth.  You feel sad and depressed.  Your baby is too sleepy to eat well.  Your baby is having trouble sleeping.   Your baby is wetting less than 3 diapers in a 24-hour period.  Your baby has less than 3 stools in a 24-hour period.  Your baby's skin or the white part of his or her eyes becomes yellow.   Your baby is not gaining weight by 50 days of age. SEEK IMMEDIATE MEDICAL CARE IF:  Your baby is overly tired (lethargic) and does not want to wake up and feed.  Your baby develops an unexplained fever.   This information is not intended to replace advice given to you by your health care provider. Make sure you discuss any questions you have with your health care provider.   Document Released: 08/14/2005 Document Revised: 05/05/2015 Document Reviewed: 02/05/2013 Elsevier Interactive Patient Education 2016 [employer].  Vaginal  Birth After Cesarean Delivery Vaginal birth after cesarean delivery (VBAC) is giving birth vaginally after previously delivering a baby by a cesarean. In the past, if a woman had a cesarean delivery, all births afterward would be done by cesarean delivery. This is no longer true. It  can be safe for the mother to try a vaginal delivery after having a cesarean delivery.  It is important to discuss VBAC with your health care provider early in the pregnancy so you can understand the risks, benefits, and options. It will give you time to decide what is best in your particular case. The final decision about whether to have a VBAC or repeat cesarean delivery should be between you and your health care provider. Any changes in your health or your baby's health during your pregnancy may make it necessary to change your initial decision about VBAC.  WOMEN WHO PLAN TO HAVE A VBAC SHOULD CHECK WITH THEIR HEALTH CARE PROVIDER TO BE SURE THAT:  The previous cesarean delivery was done with a low transverse uterine cut (incision) (not a vertical classical incision).   The birth canal is big enough for the baby.   There were no other operations on the uterus.   An electronic fetal monitor (EFM) will be on at all times during labor.   An operating room will be available and ready in case an emergency cesarean delivery is needed.   A health care provider and surgical nursing staff will be available at all times during labor to be ready to do an emergency delivery cesarean if necessary.   An anesthesiologist will be present in case an emergency cesarean delivery is needed.   The nursery is prepared and has adequate personnel and necessary equipment available to care for the baby in case of an emergency cesarean delivery. BENEFITS OF VBAC  Shorter stay in the hospital.   Avoidance of risks associated with cesarean delivery, such as:  Surgical complications, such as opening of the incision or hernia in  the incision.  Injury to other organs.  Fever. This can occur if an infection develops after surgery. It can also occur as a reaction to the medicine given to make you numb during the surgery.  Less blood loss and need for blood transfusions.  Lower risk of blood clots and infection.  Shorter recovery.   Decreased risk for having to remove the uterus (hysterectomy).   Decreased risk for the placenta to completely or partially cover the opening of the uterus (placenta previa) with a future pregnancy.   Decrease risk in future labor and delivery. RISKS OF A VBAC  Tearing (rupture) of the uterus. This is occurs in less than 1% of VBACs. The risk of this happening is higher if:  Steps are taken to begin the labor process (induce labor) or stimulate or strengthen contractions (augment labor).   Medicine is used to soften (ripen) the cervix.  Having to remove the uterus (hysterectomy) if it ruptures. VBAC SHOULD NOT BE DONE IF:  The previous cesarean delivery was done with a vertical (classical) or T-shaped incision or you do not know what kind of incision was made.   You had a ruptured uterus.   You have had certain types of surgery on your uterus, such as removal of uterine fibroids. Ask your health care provider about other types of surgeries that prevent you from having a VBAC.  You have certain medical or childbirth (obstetrical) problems.   There are problems with the baby.   You have had two previous cesarean deliveries and no vaginal deliveries. OTHER FACTS TO KNOW ABOUT VBAC:  It is safe to have an epidural anesthetic with VBAC.   It is safe to turn the baby from a breech position (attempt an external cephalic version).   It  is safe to try a VBAC with twins.   VBAC may not be successful if your baby weights 8.8 lb (4 kg) or more. However, weight predictions are not always accurate and should not be used alone to decide if VBAC is right for you.  There  is an increased failure rate if the time between the cesarean delivery and VBAC is less than 19 months.   Your health care provider may advise against a VBAC if you have preeclampsia (high blood pressure, protein in the urine, and swelling of face and extremities).   VBAC is often successful if you previously gave birth vaginally.   VBAC is often successful when the labor starts spontaneously before the due date.   Delivering a baby through a VBAC is similar to having a normal spontaneous vaginal delivery.   This information is not intended to replace advice given to you by your health care provider. Make sure you discuss any questions you have with your health care provider.   Document Released: 02/04/2007 Document Revised: 09/04/2014 Document Reviewed: 03/13/2013 Elsevier Interactive Patient Education [employer].

## 2016-03-23 NOTE — Progress Notes (Signed)
Subjective:  Amanda [name] is a 30 y.o. G5P1031 at [redacted]w[redacted]d being seen today for ongoing prenatal care.  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  Patient reports pelvic pressure.  Contractions: Irregular. Vag. Bleeding: None.  Movement: Present. Denies leaking of fluid.   The following portions of the patient's history were reviewed and updated as appropriate: allergies, current medications, past family history, past medical history, past social history, past surgical history and problem list. Problem list updated.  Objective:   Vitals:   03/23/16 0844  BP: 100/64  Pulse: (!) 107  Weight: 184 lb 6.4 oz (83.6 kg)    Fetal Status: Fetal Heart Rate (bpm): 158 Fundal Height: 42 cm Movement: Present  Presentation: Vertex  General:  Alert, oriented and cooperative. Patient is in no acute distress.  Skin: Skin is warm and dry. No rash noted.   Cardiovascular: Normal heart rate noted  Respiratory: Normal respiratory effort, no problems with respiration noted  Abdomen: Soft, gravid, appropriate for gestational age. Pain/Pressure: Present     Pelvic:  Cervical exam performed Dilation: 1.5 Effacement (%): 80 Station: -2  Extremities: Normal range of motion.  Edema: None  Mental Status: Normal mood and affect. Normal behavior. Normal judgment and thought content.   Urinalysis: Urine Protein: Negative Urine Glucose: Negative FBS 76-88 2 hr pp 71-131 Assessment and Plan:  Pregnancy: [id number] at [redacted]w[redacted]d  1. Diet controlled gestational diabetes mellitus in third trimester Continue diet and 4x/day BS testing U/s for growth next week  2.  Previous cesarean delivery affecting pregnancy, antepartum Desires TOLAC and unmedicated birth  3. Supervision of high risk pregnancy in third trimester Continue prenatal care  4. Hematuria -Urine Culture--OB  Term labor symptoms and general obstetric precautions including but not limited to vaginal bleeding, contractions, leaking of fluid and fetal movement were reviewed in detail with the patient. Please refer to After Visit Summary for other counseling recommendations.  Return in 1 week (on 03/30/2016).   Donnamae Jude, MD

## 2016-03-25 ENCOUNTER — Encounter (HOSPITAL_COMMUNITY): Payer: Self-pay

## 2016-03-25 ENCOUNTER — Inpatient Hospital Stay (HOSPITAL_COMMUNITY)
Admission: AD | Admit: 2016-03-25 | Discharge: 2016-03-25 | Disposition: A | Discharge status: Home / Self Care | Payer: Medicaid Other | Source: Ambulatory Visit | Attending: Obstetrics & Gynecology | Admitting: Obstetrics & Gynecology

## 2016-03-25 DIAGNOSIS — O471 False labor at or after 37 completed weeks of gestation: Secondary | ICD-10-CM | POA: Insufficient documentation

## 2016-03-25 DIAGNOSIS — O34219 Maternal care for unspecified type scar from previous cesarean delivery: Secondary | ICD-10-CM

## 2016-03-25 DIAGNOSIS — Z3A38 38 weeks gestation of pregnancy: Secondary | ICD-10-CM | POA: Diagnosis not present

## 2016-03-25 DIAGNOSIS — O0993 Supervision of high risk pregnancy, unspecified, third trimester: Secondary | ICD-10-CM

## 2016-03-25 LAB — CULTURE, OB URINE: Organism ID, Bacteria: NO GROWTH

## 2016-03-25 NOTE — Discharge Instructions (Signed)
Third Trimester of Pregnancy The third trimester is from week 29 through week 42, months 7 through 9. This trimester is when your unborn baby (fetus) is growing very fast. At the end of the ninth month, the unborn baby is about 20 inches in length. It weighs about 6-10 pounds.  HOME CARE   Avoid all smoking, herbs, and alcohol. Avoid drugs not approved by your doctor.  Do not use any tobacco products, including cigarettes, chewing tobacco, and electronic cigarettes. If you need help quitting, ask your doctor. You may get counseling or other support to help you quit.  Only take medicine as told by your doctor. Some medicines are safe and some are not during pregnancy.  Exercise only as told by your doctor. Stop exercising if you start having cramps.  Eat regular, healthy meals.  Wear a good support bra if your breasts are tender.  Do not use hot tubs, steam rooms, or saunas.  Wear your seat belt when driving.  Avoid raw meat, uncooked cheese, and liter boxes and soil used by cats.  Take your prenatal vitamins.  Take 1500-2000 milligrams of calcium daily starting at the 20th week of pregnancy until you deliver your baby.  Try taking medicine that helps you poop (stool softener) as needed, and if your doctor approves. Eat more fiber by eating fresh fruit, vegetables, and whole grains. Drink enough fluids to keep your pee (urine) clear or pale yellow.  Take warm water baths (sitz baths) to soothe pain or discomfort caused by hemorrhoids. Use hemorrhoid cream if your doctor approves.  If you have puffy, bulging veins (varicose veins), wear support hose. Raise (elevate) your feet for 15 minutes, 3-4 times a day. Limit salt in your diet.  Avoid heavy lifting, wear low heels, and sit up straight.  Rest with your legs raised if you have leg cramps or low back pain.  Visit your dentist if you have not gone during your pregnancy. Use a soft toothbrush to brush your teeth. Be gentle when you  floss.  You can have sex (intercourse) unless your doctor tells you not to.  Do not travel far distances unless you must. Only do so with your doctor's approval.  Take prenatal classes.  Practice driving to the hospital.  Pack your hospital bag.  Prepare the baby's room.  Go to your doctor visits. GET HELP IF:  You are not sure if you are in labor or if your water has broken.  You are dizzy.  You have mild cramps or pressure in your lower belly (abdominal).  You have a nagging pain in your belly area.  You continue to feel sick to your stomach (nauseous), throw up (vomit), or have watery poop (diarrhea).  You have bad smelling fluid coming from your vagina.  You have pain with peeing (urination). GET HELP RIGHT AWAY IF:   You have a fever.  You are leaking fluid from your vagina.  You are spotting or bleeding from your vagina.  You have severe belly cramping or pain.  You lose or gain weight rapidly.  You have trouble catching your breath and have chest pain.  You notice sudden or extreme puffiness (swelling) of your face, hands, ankles, feet, or legs.  You have not felt the baby move in over an hour.  You have severe headaches that do not go away with medicine.  You have vision changes.   This information is not intended to replace advice given to you by your health care  provider. Make sure you discuss any questions you have with your health care provider.   Document Released: 11/08/2009 Document Revised: 09/04/2014 Document Reviewed: 10/15/2012 Elsevier Interactive Patient Education 2016 [employer] of the uterus can occur throughout pregnancy. Contractions are not always a sign that you are in labor.  WHAT ARE BRAXTON HICKS CONTRACTIONS?  Contractions that occur before labor are called Braxton Hicks contractions, or false labor. Toward the end of pregnancy (32-34 weeks), these contractions can develop more often  and may become more forceful. This is not true labor because these contractions do not result in opening (dilatation) and thinning of the cervix. They are sometimes difficult to tell apart from true labor because these contractions can be forceful and people have different pain tolerances. You should not feel embarrassed if you go to the hospital with false labor. Sometimes, the only way to tell if you are in true labor is for your health care provider to look for changes in the cervix. If there are no prenatal problems or other health problems associated with the pregnancy, it is completely safe to be sent home with false labor and await the onset of true labor. HOW CAN YOU TELL THE DIFFERENCE BETWEEN TRUE AND FALSE LABOR? False Labor  The contractions of false labor are usually shorter and not as hard as those of true labor.   The contractions are usually irregular.   The contractions are often felt in the front of the lower abdomen and in the groin.   The contractions may go away when you walk around or change positions while lying down.   The contractions get weaker and are shorter lasting as time goes on.   The contractions do not usually become progressively stronger, regular, and closer together as with true labor.  True Labor  Contractions in true labor last 30-70 seconds, become very regular, usually become more intense, and increase in frequency.   The contractions do not go away with walking.   The discomfort is usually felt in the top of the uterus and spreads to the lower abdomen and low back.   True labor can be determined by your health care provider with an exam. This will show that the cervix is dilating and getting thinner.  WHAT TO REMEMBER  Keep up with your usual exercises and follow other instructions given by your health care provider.   Take medicines as directed by your health care provider.   Keep your regular prenatal appointments.   Eat and  drink lightly if you think you are going into labor.   If Braxton Hicks contractions are making you uncomfortable:   Change your position from lying down or resting to walking, or from walking to resting.   Sit and rest in a tub of warm water.   Drink 2-3 glasses of water. Dehydration may cause these contractions.   Do slow and deep breathing several times an hour.  WHEN SHOULD I SEEK IMMEDIATE MEDICAL CARE? Seek immediate medical care if:  Your contractions become stronger, more regular, and closer together.   You have fluid leaking or gushing from your vagina.   You have a fever.   You pass blood-tinged mucus.   You have vaginal bleeding.   You have continuous abdominal pain.   You have low back pain that you never had before.   You feel your baby's head pushing down and causing pelvic pressure.   Your baby is not moving as much as it  used to.    This information is not intended to replace advice given to you by your health care provider. Make sure you discuss any questions you have with your health care provider.   Document Released: 08/14/2005 Document Revised: 08/19/2013 Document Reviewed: 05/26/2013 Elsevier Interactive Patient Education 2016 [place]. Fetal Movement Counts Patient Name: __________________________________________________ Patient Due Date: ____________________ Performing a fetal movement count is highly recommended in high-risk pregnancies, but it is good for every pregnant woman to do. Your health care provider may ask you to start counting fetal movements at 28 weeks of the pregnancy. Fetal movements often increase:  After eating a full meal.  After physical activity.  After eating or drinking something sweet or cold.  At rest. Pay attention to when you feel the baby is most active. This will help you notice a pattern of your baby's sleep and wake cycles and what factors contribute to an increase in fetal movement. It is  important to perform a fetal movement count at the same time each day when your baby is normally most active.  HOW TO COUNT FETAL MOVEMENTS 1. Find a quiet and comfortable area to sit or lie down on your left side. Lying on your left side provides the best blood and oxygen circulation to your baby. 2. Write down the day and time on a sheet of paper or in a journal. 3. Start counting kicks, flutters, swishes, rolls, or jabs in a 2-hour period. You should feel at least 10 movements within 2 hours. 4. If you do not feel 10 movements in 2 hours, wait 2-3 hours and count again. Look for a change in the pattern or not enough counts in 2 hours. SEEK MEDICAL CARE IF:  You feel less than 10 counts in 2 hours, tried twice.  There is no movement in over an hour.  The pattern is changing or taking longer each day to reach 10 counts in 2 hours.  You feel the baby is not moving as he or she usually does. Date: ____________ Movements: ____________ Start time: ____________ Elizebeth Koller time: ____________  Date: ____________ Movements: ____________ Start time: ____________ Elizebeth Koller time: ____________ Date: ____________ Movements: ____________ Start time: ____________ Elizebeth Koller time: ____________ Date: ____________ Movements: ____________ Start time: ____________ Elizebeth Koller time: ____________ Date: ____________ Movements: ____________ Start time: ____________ Elizebeth Koller time: ____________ Date: ____________ Movements: ____________ Start time: ____________ Elizebeth Koller time: ____________ Date: ____________ Movements: ____________ Start time: ____________ Elizebeth Koller time: ____________ Date: ____________ Movements: ____________ Start time: ____________ Elizebeth Koller time: ____________  Date: ____________ Movements: ____________ Start time: ____________ Elizebeth Koller time: ____________ Date: ____________ Movements: ____________ Start time: ____________ Elizebeth Koller time: ____________ Date: ____________ Movements: ____________ Start time: ____________ Elizebeth Koller  time: ____________ Date: ____________ Movements: ____________ Start time: ____________ Elizebeth Koller time: ____________ Date: ____________ Movements: ____________ Start time: ____________ Elizebeth Koller time: ____________ Date: ____________ Movements: ____________ Start time: ____________ Elizebeth Koller time: ____________ Date: ____________ Movements: ____________ Start time: ____________ Elizebeth Koller time: ____________  Date: ____________ Movements: ____________ Start time: ____________ Elizebeth Koller time: ____________ Date: ____________ Movements: ____________ Start time: ____________ Elizebeth Koller time: ____________ Date: ____________ Movements: ____________ Start time: ____________ Elizebeth Koller time: ____________ Date: ____________ Movements: ____________ Start time: ____________ Elizebeth Koller time: ____________ Date: ____________ Movements: ____________ Start time: ____________ Elizebeth Koller time: ____________ Date: ____________ Movements: ____________ Start time: ____________ Elizebeth Koller time: ____________ Date: ____________ Movements: ____________ Start time: ____________ Elizebeth Koller time: ____________  Date: ____________ Movements: ____________ Start time: ____________ Elizebeth Koller time: ____________ Date: ____________ Movements: ____________ Start time: ____________ Elizebeth Koller time: ____________ Date: ____________ Movements: ____________ Start time: ____________  Finish time: ____________ Date: ____________ Movements: ____________ Start time: ____________ Elizebeth Koller time: ____________ Date: ____________ Movements: ____________ Start time: ____________ Elizebeth Koller time: ____________ Date: ____________ Movements: ____________ Start time: ____________ Elizebeth Koller time: ____________ Date: ____________ Movements: ____________ Start time: ____________ Elizebeth Koller time: ____________  Date: ____________ Movements: ____________ Start time: ____________ Elizebeth Koller time: ____________ Date: ____________ Movements: ____________ Start time: ____________ Elizebeth Koller time: ____________ Date: ____________  Movements: ____________ Start time: ____________ Elizebeth Koller time: ____________ Date: ____________ Movements: ____________ Start time: ____________ Elizebeth Koller time: ____________ Date: ____________ Movements: ____________ Start time: ____________ Elizebeth Koller time: ____________ Date: ____________ Movements: ____________ Start time: ____________ Elizebeth Koller time: ____________ Date: ____________ Movements: ____________ Start time: ____________ Elizebeth Koller time: ____________  Date: ____________ Movements: ____________ Start time: ____________ Elizebeth Koller time: ____________ Date: ____________ Movements: ____________ Start time: ____________ Elizebeth Koller time: ____________ Date: ____________ Movements: ____________ Start time: ____________ Elizebeth Koller time: ____________ Date: ____________ Movements: ____________ Start time: ____________ Elizebeth Koller time: ____________ Date: ____________ Movements: ____________ Start time: ____________ Elizebeth Koller time: ____________ Date: ____________ Movements: ____________ Start time: ____________ Elizebeth Koller time: ____________ Date: ____________ Movements: ____________ Start time: ____________ Elizebeth Koller time: ____________  Date: ____________ Movements: ____________ Start time: ____________ Elizebeth Koller time: ____________ Date: ____________ Movements: ____________ Start time: ____________ Elizebeth Koller time: ____________ Date: ____________ Movements: ____________ Start time: ____________ Elizebeth Koller time: ____________ Date: ____________ Movements: ____________ Start time: ____________ Elizebeth Koller time: ____________ Date: ____________ Movements: ____________ Start time: ____________ Elizebeth Koller time: ____________ Date: ____________ Movements: ____________ Start time: ____________ Elizebeth Koller time: ____________ Date: ____________ Movements: ____________ Start time: ____________ Elizebeth Koller time: ____________  Date: ____________ Movements: ____________ Start time: ____________ Elizebeth Koller time: ____________ Date: ____________ Movements: ____________ Start time:  ____________ Elizebeth Koller time: ____________ Date: ____________ Movements: ____________ Start time: ____________ Elizebeth Koller time: ____________ Date: ____________ Movements: ____________ Start time: ____________ Elizebeth Koller time: ____________ Date: ____________ Movements: ____________ Start time: ____________ Elizebeth Koller time: ____________ Date: ____________ Movements: ____________ Start time: ____________ Elizebeth Koller time: ____________   This information is not intended to replace advice given to you by your health care provider. Make sure you discuss any questions you have with your health care provider.   Document Released: 09/13/2006 Document Revised: 09/04/2014 Document Reviewed: 06/10/2012 Elsevier Interactive Patient Education [employer].

## 2016-03-25 NOTE — MAU Note (Signed)
Earlier today saw slight spotting and again this am. Just wanted to be sure everything is ok. HAd cervical exam on THurs. 1.5cm and 80%. Denies LOF

## 2016-03-27 ENCOUNTER — Encounter (HOSPITAL_COMMUNITY): Payer: Self-pay | Admitting: *Deleted

## 2016-03-27 ENCOUNTER — Inpatient Hospital Stay (HOSPITAL_COMMUNITY)
Admission: AD | Admit: 2016-03-27 | Discharge: 2016-03-27 | Disposition: A | Discharge status: Home / Self Care | Payer: Medicaid Other | Source: Ambulatory Visit | Attending: Obstetrics and Gynecology | Admitting: Obstetrics and Gynecology

## 2016-03-27 HISTORY — DX: Gestational diabetes mellitus in pregnancy, unspecified control: O24.419

## 2016-03-27 NOTE — MAU Note (Signed)
Contractions every 6-57mins, no leaking of fluid or bleeding, baby moving

## 2016-03-27 NOTE — Discharge Instructions (Signed)
Braxton Hicks Contractions °Contractions of the uterus can occur throughout pregnancy. Contractions are not always a sign that you are in labor.  °WHAT ARE BRAXTON HICKS CONTRACTIONS?  °Contractions that occur before labor are called Braxton Hicks contractions, or false labor. Toward the end of pregnancy (32-34 weeks), these contractions can develop more often and may become more forceful. This is not true labor because these contractions do not result in opening (dilatation) and thinning of the cervix. They are sometimes difficult to tell apart from true labor because these contractions can be forceful and people have different pain tolerances. You should not feel embarrassed if you go to the hospital with false labor. Sometimes, the only way to tell if you are in true labor is for your health care provider to look for changes in the cervix. °If there are no prenatal problems or other health problems associated with the pregnancy, it is completely safe to be sent home with false labor and await the onset of true labor. °HOW CAN YOU TELL THE DIFFERENCE BETWEEN TRUE AND FALSE LABOR? °False Labor °· The contractions of false labor are usually shorter and not as hard as those of true labor.   °· The contractions are usually irregular.   °· The contractions are often felt in the front of the lower abdomen and in the groin.   °· The contractions may go away when you walk around or change positions while lying down.   °· The contractions get weaker and are shorter lasting as time goes on.   °· The contractions do not usually become progressively stronger, regular, and closer together as with true labor.   °True Labor °· Contractions in true labor last 30-70 seconds, become very regular, usually become more intense, and increase in frequency.   °· The contractions do not go away with walking.   °· The discomfort is usually felt in the top of the uterus and spreads to the lower abdomen and low back.   °· True labor can be  determined by your health care provider with an exam. This will show that the cervix is dilating and getting thinner.   °WHAT TO REMEMBER °· Keep up with your usual exercises and follow other instructions given by your health care provider.   °· Take medicines as directed by your health care provider.   °· Keep your regular prenatal appointments.   °· Eat and drink lightly if you think you are going into labor.   °· If Braxton Hicks contractions are making you uncomfortable:   °¨ Change your position from lying down or resting to walking, or from walking to resting.   °¨ Sit and rest in a tub of warm water.   °¨ Drink 2-3 glasses of water. Dehydration may cause these contractions.   °¨ Do slow and deep breathing several times an hour.   °WHEN SHOULD I SEEK IMMEDIATE MEDICAL CARE? °Seek immediate medical care if: °· Your contractions become stronger, more regular, and closer together.   °· You have fluid leaking or gushing from your vagina.   °· You have a fever.   °· You pass blood-tinged mucus.   °· You have vaginal bleeding.   °· You have continuous abdominal pain.   °· You have low back pain that you never had before.   °· You feel your baby's head pushing down and causing pelvic pressure.   °· Your baby is not moving as much as it used to.   °  °This information is not intended to replace advice given to you by your health care provider. Make sure you discuss any questions you have with your health care   provider.   Document Released: 08/14/2005 Document Revised: 08/19/2013 Document Reviewed: 05/26/2013 Elsevier Interactive Patient Education 2016 [place] Natural childbirth is going through labor and delivery without any drugs to relieve pain. You also do not use fetal monitors, have a cesarean delivery, or get a sugical cut to enlarge the vaginal opening (episiotomy). With the help of a birthing professional (midwife), you will direct your own labor and delivery as you choose. Many  women chose natural childbirth because they feel more in control and in touch with their labor and delivery. They are also concerned about the medications affecting themselves and the baby. Pregnant women with a high risk pregnancy should not attempt natural childbirth. It is better to deliver the infant in a hospital if an emergency situation arises. Sometimes, the caregiver has to intervene for the health and safety of the mother and infant. TWO TECHNIQUES FOR NATURAL CHILDBIRTH:   The Lamaze method. This method teaches women that having a baby is normal, healthy, and natural. It also teaches the mother to take a neutral position regarding pain medication and anesthesia and to make an informed decision if and when it is right for them.  The Hulan Fray (also called husband coached birth). This method teaches the father to be the birth coach and stresses a natural approach. It also encourages exercise and a balanced diet with good nutrition. The exercises teach relaxation and deep breathing techniques. However, there are also classes to prepare the parents for an emergency situation that may occur. METHODS OF DEALING WITH LABOR PAIN AND DELIVERY:  Meditation.  Yoga.  Hypnosis.  Acupuncture.  Massage.  Changing positions (walking, rocking, showering, leaning on birth balls).  Lying in warm water or a jacuzzi.  Find an activity that keeps your mind off of the labor pain.  Listen to soft music.  Visual imagery (focus on a particular object). BEFORE GOING INTO LABOR  Be sure you and your spouse/partner are in agreement to have natural childbirth.  Decide if your caregiver or a midwife will deliver your baby.  Decide if you will have your baby in the hospital, birthing center, or at home.  If you have children, make plans to have someone to take care of them when you go to the hospital.  Know the distance and the time it takes to go to the delivery center. Make a dry run to be  sure.  Have a bag packed with a night gown, bathrobe, and toiletries ready to take when you go into labor.  Keep phone numbers of your family and friends handy if you need to call someone when you go into labor.  Your spouse or partner should go to all the teaching classes.  Talk with your caregiver about the possibility of a medical emergency and what will happen if that occurs. ADVANTAGES OF NATURAL CHILDBIRTH  You are in control of your labor and delivery.  It is safe.  There are no medications or anesthetics that may affect you and the fetus.  There are no invasive procedures such as an episiotomy.  You and your partner will work together, which can increase your bond.  Meditation, yoga, massage, and breathing exercises can be learned while pregnant and help you when you are in labor and at delivery.  In most delivery centers, the family and friends can be involved in the labor and delivery process. DISADVANTAGES OF NATURAL CHILDBIRTH  You will experience pain during your labor and delivery.  The methods of  helping relieve your labor pains may not work for you.  You may feel embarrassed, disappointed, and like a failure if you decide to change your mind during labor and not have natural childbirth. AFTER THE DELIVERY  You will be very tired.  You will be uncomfortable because of your uterus contracting. You will feel soreness around the vagina.  You may feel cold and shaky.This is a natural reaction.  You will be excited, overwhelmed, accomplished, and proud to be a mother. HOME CARE INSTRUCTIONS   Follow the advice and instructions of your caregiver.  Follow the instructions of your natural childbirth instructor (Lamaze or [place]).   This information is not intended to replace advice given to you by your health care provider. Make sure you discuss any questions you have with your health care provider.   Document Released: 07/27/2008 Document Revised:  11/06/2011 Document Reviewed: 04/21/2013 Elsevier Interactive Patient Education [employer].

## 2016-03-27 NOTE — Progress Notes (Addendum)
   03/27/16 2105  Provider Notification  Provider Name/Title (Dr. Nyoka Cowden)  Method of Notification Phone  Notification Reason Membrane status;Vaginal exam;Uterine activity  Informed Dr. Helane Rima pt here for labor check. G5 P1 at 38wks and 6days, previous c/s wants tolac, GDM diet controlled, HSV, 2-3/70/-2, cat 1 strip, UC 80min apart.  no bleeding or leaking of fluid, GBS neg. No orderd given. Provider will call back

## 2016-03-28 ENCOUNTER — Encounter (HOSPITAL_COMMUNITY): Payer: Self-pay

## 2016-03-28 ENCOUNTER — Inpatient Hospital Stay (HOSPITAL_COMMUNITY)
Admission: AD | Admit: 2016-03-28 | Discharge: 2016-03-30 | DRG: 774 | Disposition: A | Discharge status: Home / Self Care | Payer: Medicaid Other | Source: Ambulatory Visit | Attending: Obstetrics and Gynecology | Admitting: Obstetrics and Gynecology

## 2016-03-28 DIAGNOSIS — Z87891 Personal history of nicotine dependence: Secondary | ICD-10-CM | POA: Diagnosis not present

## 2016-03-28 DIAGNOSIS — O99344 Other mental disorders complicating childbirth: Secondary | ICD-10-CM | POA: Diagnosis present

## 2016-03-28 DIAGNOSIS — O4202 Full-term premature rupture of membranes, onset of labor within 24 hours of rupture: Secondary | ICD-10-CM | POA: Diagnosis present

## 2016-03-28 DIAGNOSIS — Z833 Family history of diabetes mellitus: Secondary | ICD-10-CM | POA: Diagnosis not present

## 2016-03-28 DIAGNOSIS — O9832 Other infections with a predominantly sexual mode of transmission complicating childbirth: Secondary | ICD-10-CM | POA: Diagnosis present

## 2016-03-28 DIAGNOSIS — Z3A39 39 weeks gestation of pregnancy: Secondary | ICD-10-CM

## 2016-03-28 DIAGNOSIS — O24420 Gestational diabetes mellitus in childbirth, diet controlled: Principal | ICD-10-CM | POA: Diagnosis present

## 2016-03-28 DIAGNOSIS — O34211 Maternal care for low transverse scar from previous cesarean delivery: Secondary | ICD-10-CM | POA: Diagnosis present

## 2016-03-28 DIAGNOSIS — O34219 Maternal care for unspecified type scar from previous cesarean delivery: Secondary | ICD-10-CM

## 2016-03-28 DIAGNOSIS — Z3483 Encounter for supervision of other normal pregnancy, third trimester: Secondary | ICD-10-CM | POA: Diagnosis present

## 2016-03-28 DIAGNOSIS — D573 Sickle-cell trait: Secondary | ICD-10-CM | POA: Diagnosis present

## 2016-03-28 DIAGNOSIS — O4290 Premature rupture of membranes, unspecified as to length of time between rupture and onset of labor, unspecified weeks of gestation: Secondary | ICD-10-CM | POA: Diagnosis present

## 2016-03-28 DIAGNOSIS — O9902 Anemia complicating childbirth: Secondary | ICD-10-CM | POA: Diagnosis present

## 2016-03-28 DIAGNOSIS — O0993 Supervision of high risk pregnancy, unspecified, third trimester: Secondary | ICD-10-CM

## 2016-03-28 DIAGNOSIS — O3663X0 Maternal care for excessive fetal growth, third trimester, not applicable or unspecified: Secondary | ICD-10-CM | POA: Diagnosis present

## 2016-03-28 DIAGNOSIS — Z832 Family history of diseases of the blood and blood-forming organs and certain disorders involving the immune mechanism: Secondary | ICD-10-CM

## 2016-03-28 DIAGNOSIS — F418 Other specified anxiety disorders: Secondary | ICD-10-CM | POA: Diagnosis present

## 2016-03-28 DIAGNOSIS — A6000 Herpesviral infection of urogenital system, unspecified: Secondary | ICD-10-CM | POA: Diagnosis present

## 2016-03-28 LAB — TYPE AND SCREEN
ABO/RH(D): O POS
ANTIBODY SCREEN: NEGATIVE

## 2016-03-28 LAB — CBC
HEMATOCRIT: 32.5 % — AB (ref 36.0–46.0)
HEMOGLOBIN: 11.2 g/dL — AB (ref 12.0–15.0)
MCH: 29.6 pg (ref 26.0–34.0)
MCHC: 34.5 g/dL (ref 30.0–36.0)
MCV: 85.8 fL (ref 78.0–100.0)
Platelets: 136 10*3/uL — ABNORMAL LOW (ref 150–400)
RBC: 3.79 MIL/uL — ABNORMAL LOW (ref 3.87–5.11)
RDW: 16 % — AB (ref 11.5–15.5)
WBC: 8.3 10*3/uL (ref 4.0–10.5)

## 2016-03-28 LAB — RPR: RPR Ser Ql: NONREACTIVE

## 2016-03-28 LAB — POCT FERN TEST: POCT FERN TEST: POSITIVE

## 2016-03-28 MED ORDER — ONDANSETRON HCL 4 MG/2ML IJ SOLN
4.0000 mg | Freq: Four times a day (QID) | INTRAMUSCULAR | Status: DC | PRN
  Administered 2016-03-28: 4 mg via INTRAVENOUS
  Filled 2016-03-28: qty 2

## 2016-03-28 MED ORDER — LACTATED RINGERS IV SOLN
INTRAVENOUS | Status: DC
  Administered 2016-03-28: 02:00:00 via INTRAVENOUS

## 2016-03-28 MED ORDER — ACETAMINOPHEN 325 MG PO TABS: 650.0000 mg | ORAL_TABLET | ORAL | Status: DC | PRN

## 2016-03-28 MED ORDER — LACTATED RINGERS IV SOLN: 500.0000 mL | INTRAVENOUS | Status: DC | PRN

## 2016-03-28 MED ORDER — PRENATAL MULTIVITAMIN CH
1.0000 | ORAL_TABLET | Freq: Every day | ORAL | Status: DC
  Administered 2016-03-28 – 2016-03-30 (×3): 1 via ORAL
  Filled 2016-03-28 (×3): qty 1

## 2016-03-28 MED ORDER — FENTANYL CITRATE (PF) 100 MCG/2ML IJ SOLN: 100.0000 ug | INTRAMUSCULAR | Status: DC | PRN

## 2016-03-28 MED ORDER — SOD CITRATE-CITRIC ACID 500-334 MG/5ML PO SOLN: 30.0000 mL | ORAL | Status: DC | PRN

## 2016-03-28 MED ORDER — DIBUCAINE 1 % RE OINT: 1.0000 "application " | TOPICAL_OINTMENT | RECTAL | Status: DC | PRN

## 2016-03-28 MED ORDER — LIDOCAINE HCL (PF) 1 % IJ SOLN
30.0000 mL | INTRAMUSCULAR | Status: DC | PRN
  Filled 2016-03-28: qty 30

## 2016-03-28 MED ORDER — SENNOSIDES-DOCUSATE SODIUM 8.6-50 MG PO TABS
2.0000 | ORAL_TABLET | ORAL | Status: DC
  Administered 2016-03-29 (×2): 2 via ORAL
  Filled 2016-03-28 (×2): qty 2

## 2016-03-28 MED ORDER — OXYTOCIN 40 UNITS IN LACTATED RINGERS INFUSION - SIMPLE MED
2.5000 [IU]/h | INTRAVENOUS | Status: DC
  Filled 2016-03-28: qty 1000

## 2016-03-28 MED ORDER — OXYCODONE-ACETAMINOPHEN 5-325 MG PO TABS
1.0000 | ORAL_TABLET | Freq: Four times a day (QID) | ORAL | Status: DC | PRN
  Administered 2016-03-28 (×2): 1 via ORAL
  Filled 2016-03-28 (×3): qty 1

## 2016-03-28 MED ORDER — OXYCODONE-ACETAMINOPHEN 5-325 MG PO TABS: 2.0000 | ORAL_TABLET | ORAL | Status: DC | PRN

## 2016-03-28 MED ORDER — OXYCODONE-ACETAMINOPHEN 5-325 MG PO TABS
1.0000 | ORAL_TABLET | ORAL | Status: DC | PRN
  Administered 2016-03-28 – 2016-03-29 (×5): 1 via ORAL
  Filled 2016-03-28 (×5): qty 1

## 2016-03-28 MED ORDER — FENTANYL CITRATE (PF) 100 MCG/2ML IJ SOLN
INTRAMUSCULAR | Status: AC
  Administered 2016-03-28: 100 ug
  Filled 2016-03-28: qty 2

## 2016-03-28 MED ORDER — ONDANSETRON HCL 4 MG/2ML IJ SOLN: 4.0000 mg | INTRAMUSCULAR | Status: DC | PRN

## 2016-03-28 MED ORDER — ONDANSETRON HCL 4 MG PO TABS: 4.0000 mg | ORAL_TABLET | ORAL | Status: DC | PRN

## 2016-03-28 MED ORDER — DIPHENHYDRAMINE HCL 25 MG PO CAPS: 25.0000 mg | ORAL_CAPSULE | Freq: Four times a day (QID) | ORAL | Status: DC | PRN

## 2016-03-28 MED ORDER — FLEET ENEMA 7-19 GM/118ML RE ENEM: 1.0000 | ENEMA | RECTAL | Status: DC | PRN

## 2016-03-28 MED ORDER — OXYTOCIN BOLUS FROM INFUSION
500.0000 mL | Freq: Once | INTRAVENOUS | Status: AC
  Administered 2016-03-28: 500 mL via INTRAVENOUS

## 2016-03-28 MED ORDER — SIMETHICONE 80 MG PO CHEW: 80.0000 mg | CHEWABLE_TABLET | ORAL | Status: DC | PRN

## 2016-03-28 MED ORDER — BENZOCAINE-MENTHOL 20-0.5 % EX AERO
1.0000 "application " | INHALATION_SPRAY | CUTANEOUS | Status: DC | PRN
  Filled 2016-03-28: qty 56

## 2016-03-28 MED ORDER — TETANUS-DIPHTH-ACELL PERTUSSIS 5-2.5-18.5 LF-MCG/0.5 IM SUSP: 0.5000 mL | Freq: Once | INTRAMUSCULAR | Status: DC

## 2016-03-28 MED ORDER — OXYCODONE-ACETAMINOPHEN 5-325 MG PO TABS
1.0000 | ORAL_TABLET | ORAL | Status: DC | PRN
  Administered 2016-03-28: 1 via ORAL
  Filled 2016-03-28: qty 1

## 2016-03-28 MED ORDER — WITCH HAZEL-GLYCERIN EX PADS: 1.0000 "application " | MEDICATED_PAD | CUTANEOUS | Status: DC | PRN

## 2016-03-28 MED ORDER — COCONUT OIL OIL
1.0000 "application " | TOPICAL_OIL | Status: DC | PRN
  Filled 2016-03-28: qty 120

## 2016-03-28 MED ORDER — ZOLPIDEM TARTRATE 5 MG PO TABS: 5.0000 mg | ORAL_TABLET | Freq: Every evening | ORAL | Status: DC | PRN

## 2016-03-28 MED ORDER — IBUPROFEN 600 MG PO TABS
600.0000 mg | ORAL_TABLET | Freq: Four times a day (QID) | ORAL | Status: DC
  Administered 2016-03-28 – 2016-03-30 (×9): 600 mg via ORAL
  Filled 2016-03-28 (×9): qty 1

## 2016-03-28 MED ORDER — ACETAMINOPHEN 325 MG PO TABS
650.0000 mg | ORAL_TABLET | ORAL | Status: DC | PRN
  Filled 2016-03-28: qty 2

## 2016-03-28 NOTE — Progress Notes (Signed)
Called MD regarding patient's pain; requested to change Percocet order from q6 to q4; received new order.  Patient's perineum and back are hurting at a 6/10, especially with activity.  Report given to night shift RN.

## 2016-03-28 NOTE — Progress Notes (Signed)
Notified of pt arrival in MAU and exam with positive fern. Will admit to labor and delivery

## 2016-03-28 NOTE — Progress Notes (Signed)
UR chart review completed.  

## 2016-03-28 NOTE — Lactation Note (Signed)
This note was copied from a baby's chart. Lactation Consultation Note  Patient Name: Amanda [name] [id number] Date: 03/28/2016 Reason for consult: Initial assessment  Initial visit at 6 hours of life. Mom is a P2 who did not nurse her 1st child. She is very interested in ensuring that breastfeeding this child is successful. "[name]" was observed feeding at breast. Mom assisted w/tiny adjustments to improve latch. Mom also taught breast compression to increase swallowing frequency. Mom & infant are off to an excellent start w/breastfeeding.   Mom has Logan. Mom reports + breast changes w/pregnancy.   Mom made aware of O/P services, breastfeeding support groups, community resources, and our phone # for post-discharge questions.    Matthias Hughs [facility] 03/28/2016, 11:08 AM

## 2016-03-28 NOTE — MAU Note (Signed)
Pt presents stating her water broke at 0100. Some bloody show and worsening contractions.

## 2016-03-28 NOTE — H&P (Signed)
LABOR AND DELIVERY ADMISSION HISTORY AND PHYSICAL NOTE  Amanda [name] is a 30 y.o. female [id number] with IUP at [redacted]w[redacted]d by 6 wk [place] presenting in active labor. This is a TOLAC, her older daughter was born via C/S because of concerns of infection (per patient's report).  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 (from Dr. Virginia Crews note from 03/23/16)  She reports positive fetal movement. She was seen in the MAU and was sent home earlier this evening. She reports that her water broke approximately 1.5 hours ago and then she returned to MAU in active labor.  Prenatal History/Complications:  Past Medical History: Past Medical History:  Diagnosis Date  . Anemia   . Gestational diabetes    Gestational DM, diet controlled  . Hypoglycemia   . Sickle cell trait [facility])     Past Surgical History: Past Surgical History:  Procedure Laterality Date  . CESAREAN SECTION     FTP  . DILATION AND CURETTAGE OF UTERUS    . HERNIA REPAIR    . UMBILICAL HERNIA REPAIR      Obstetrical History: OB History    Gravida Para Term Preterm AB Living   5 1 1   3 1    SAB TAB Ectopic Multiple Live Births   1 1 1   1       Social History: Social History   Social History  . Marital status: Married    Spouse name: N/A  . Number of children: N/A  . Years of education: N/A   Social History Main Topics  . Smoking status: Former Smoker    Quit date: 11/18/2009  . Smokeless tobacco: Never Used  . Alcohol use No  . Drug use: No  . Sexual activity: No   Other Topics Concern  . None   Social History  Narrative  . None    Family History: Family History  Problem Relation Age of Onset  . Hyperlipidemia Mother   . Diabetes Father   . Hepatitis C Father   . Sickle cell anemia Father     Allergies: No Known Allergies  Review of Systems   All systems reviewed and negative except as stated in HPI  Last menstrual period 06/29/2015. General appearance: alert, cooperative and mild distress Lungs: clear to auscultation bilaterally Heart: regular rate and rhythm Abdomen: soft,  Extremities: No calf swelling or tenderness  Presentation: cephalic Fetal monitoring: 120 BPM, mod variability, accels, no decels Uterine activity: ctx 3-6 min apart, 70-200 sec each  :Dilation: 7.5 Effacement (%): 100 Exam by:: Gardiner Rhyme RN   Prenatal labs: Clinic  high risk; FOB desires to be really involved with birth Prenatal Labs  Dating  6 week [place] Blood type: O/POS/-- (01/24 1110)   Genetic Screen   Quad: Neg    NIPS: Declined Antibody:NEG (01/24 1110)  Anatomic [place]  EIF and bil fetal renal pyelectasis > rescan 5/12> persistent Rubella: 2.12 (01/24 1110)  GTT Early:               Third trimester: 180 >  3 hours postive RPR: NON REAC (01/24 1110)   Flu vaccine  declined HBsAg: NEGATIVE (01/24 1110)  TDaP vaccine   01/11/16                                            Rhogam:  n/a HIV: NONREACTIVE (01/24 1110)   Baby Food    Breast                                         GBS:neg  Contraception   Unsure Pap: negative 08/2015 (fungal)  Circumcision   outpatient   Pediatrician  [facility]   Support Person  John (FOB)     Prenatal Transfer Tool  Maternal Diabetes: Yes:  Diabetes Type:  Diet controlled Genetic Screening: Declined Maternal Ultrasounds/Referrals: Normal Fetal Ultrasounds or other Referrals: Normal Maternal Substance Abuse:  No Significant Maternal Medications:  No current facility-administered medications on file prior to encounter.    Current Outpatient Prescriptions on File Prior  to Encounter  Medication Sig Dispense Refill  . Prenatal Vit-Fe Fumarate-FA (PRENATAL VITAMINS PLUS PO) Take 1 tablet by mouth daily.    . valACYclovir (VALTREX) 1000 MG tablet TAKE 1 TABLET (1,000 MG TOTAL) BY MOUTH 2 (TWO) TIMES DAILY FOR 5 DAYS THEN 1 DAILY FOR 1 YEAR.   "OFFICE VISIT NEEDED FOR REFILLS" 30 tablet 0  . valACYclovir (VALTREX) 500 MG tablet Take 500 mg by mouth daily.     Significant Maternal Lab Results:   Results for orders placed or performed during the hospital encounter of 03/28/16 (from the past 24 hour(s))  Fern Test   Collection Time: 03/28/16  1:47 AM  Result Value Ref Range   POCT Fern Test Positive = ruptured amniotic membanes   CBC   Collection Time: 03/28/16  1:50 AM  Result Value Ref Range   WBC 8.3 4.0 - 10.5 K/uL   RBC 3.79 (L) 3.87 - 5.11 MIL/uL   Hemoglobin 11.2 (L) 12.0 - 15.0 g/dL   HCT 32.5 (L) 36.0 - 46.0 %   MCV 85.8 78.0 - 100.0 fL   MCH 29.6 26.0 - 34.0 pg   MCHC 34.5 30.0 - 36.0 g/dL   RDW 16.0 (H) 11.5 - 15.5 %   Platelets 136 (L) 150 - 400 K/uL    Patient Active Problem List   Diagnosis Date Noted  . ROM (rupture of membranes), premature 03/28/2016  . Fetal macrosomia during pregnancy in second trimester 03/15/2016  . GDM, class A1 03/06/2016  . Anxiety state 11/18/2015  . Depression 11/18/2015  . Supervision of high risk pregnancy in third trimester 10/19/2015  . Hemoglobin A-S genotype ([place]) 09/26/2015  . Previous cesarean delivery affecting pregnancy, antepartum 09/21/2015  . HSV (herpes simplex virus) anogenital infection 07/12/2014    Assessment: Amanda [name] is a 30 y.o. High Risk [id number] at [redacted]w[redacted]d here in SOL for TOLAC / labor management.  #Labor: Expectant management #Pain: Fentanyl 100 mcg/hr prn (Does NOT want epidural) #FWB:  Cat 1 #ID:  GBS neg, h/o HSV treated with valcyclovir #MOF: Breast #MOC: Undecided #Circ:  Out pt  #GDM (A1): 75g oral GTT 6 weks PP  Casimer Leek, MD, PGY-1, MPH 03/28/2016, 2:45  AM  OB FELLOW HISTORY AND PHYSICAL ATTESTATION  I have seen and examined this patient; I agree with above documentation in the resident's note.    [name] 03/28/2016, 5:02 AM

## 2016-03-29 ENCOUNTER — Ambulatory Visit (HOSPITAL_COMMUNITY): Admission: RE | Admit: 2016-03-29 | Payer: Medicaid Other | Source: Ambulatory Visit

## 2016-03-29 ENCOUNTER — Encounter: Payer: Medicaid Other | Admitting: Obstetrics and Gynecology

## 2016-03-29 ENCOUNTER — Ambulatory Visit (HOSPITAL_COMMUNITY): Payer: Medicaid Other

## 2016-03-29 NOTE — Progress Notes (Signed)
Post Partum Day 1 of SVD  Subjective: no complaints, up ad lib, voiding, tolerating PO and + flatus  Objective: Blood pressure 91/67, pulse 95, temperature 98.2 F (36.8 C), resp. rate 18, height 5\' 5"  (1.651 m), weight 184 lb (83.5 kg), last menstrual period 06/29/2015, currently breastfeeding.  Physical Exam:  General: alert, cooperative and no distress Lochia: appropriate Uterine Fundus: firm Incision: NA DVT Evaluation: No evidence of DVT seen on physical exam.   Recent Labs  03/28/16 0150  HGB 11.2*  HCT 32.5*    Assessment/Plan: Plan for discharge tomorrow Working on breastfeeding today.  Undecided re birth control Boy - out pt circ   LOS: 1 day   Casimer Leek, MD, PGY-1, MPH 03/29/2016, 8:01 AM    CNM attestation Post Partum Day #1 I have seen and examined this patient and agree with above documentation in the resident's note.   Amanda [name] is a 30 y.o. [id number] s/p VBAC.  Pt denies problems with ambulating, voiding or po intake. Pain is well controlled.  Plan for birth control is undecided.  Method of Feeding: breast  PE:  BP 91/67   Pulse 95   Temp 98.2 F (36.8 C)   Resp 18   Ht 5\' 5"  (1.651 m)   Wt 83.5 kg (184 lb)   LMP 06/29/2015   Breastfeeding? Unknown   BMI 30.62 kg/m  Fundus firm  Plan for discharge: 03/30/16  Serita Grammes, CNM 1:00 PM  03/29/2016

## 2016-03-29 NOTE — Clinical Social Work Maternal (Signed)
CLINICAL SOCIAL WORK MATERNAL/CHILD NOTE  Patient Details  Name: Isidro Monks MRN: [id number] Date of Birth: 12/08/1985  Date:  2016/08/05  Clinical Social Worker Initiating Note:  Laurey Arrow Date/ Time Initiated:  03/29/16/1146     Child's Name:  Fabio Asa   Legal Guardian:  Mother   Need for Interpreter:  None   Date of Referral:  09-05-15     Reason for Referral:  Behavioral Health Issues, including SI    Referral Source:  Central Nursery   Address:  [address] [place] [address]  Phone number:  [phone]   Household Members:  Self, Minor Children, Significant Other   Natural Supports (not living in the home):  Parent, Spouse/significant other   Professional Supports: None (MOB declined all resources)   Employment: Part-time   Type of Work:     Education:  Geophysical data processor Resources:  [employer]   Other Resources:  Physicist, medical , [place] Considerations Which May Impact Care:  None Reported  Strengths:  Ability to meet basic needs , Home prepared for child , Pediatrician chosen    Risk Factors/Current Problems:  Mental Health Concerns    Cognitive State:  Alert , Linear Thinking    Mood/Affect:  Calm , Interested , Comfortable    CSW Assessment: CSW met MOB to complete an assessment for hx of anxiety and depression.  MOB was receptive to meeting with CSW.  When CSW inquired about MOB's room guest, the female introduced his self as MOB's husband (Mr. Sedeno), and MOB introduced the child as MOB's 11 year old daughter.  CSW attempted to inquire about MOB's L&D and hospital experience, until CSW was rudley interrupted by FOB.  FOB asked CSW "Can you get to the point and tell us why you are really here?"  CSW explained again that she was meeting with MOB and family to inquire about MOB's MH needs, educated MOB about PPD and SIDS, and to share community referrals and resources with family. MOB was engaged and  appropriately answered CSW's questions pertaining to MOB's MH.  MOB acknowledged that during her 2nd trimester MOB was dx with anxiety.  MOB shared symptoms with CSW and denied taking any medications or attending outpatient counseling. CSW offered MOB resources and referrals and MOB declined services.  FOB communicated to CSW "people should not come in here if not requested."  CSW again reminded FOB of CSW's role and reason for visiting with MOB.  CSW attempted to educated MOB about PPD, sharing possible signs and symptoms.  MOB denied having PPD with MOB's older child. FOB communicated to CSW that "I don't like how you are saying you might experience feelings of sadness. It's like you are speaking things in existence for my family."  CSW apologized to FOB and explained that CSW just wants to educate MOB about PPD and make sure MOB knows signs and resources if she does experience PPD.  CSW also educated MOB about SIDS.  MOB was knowledgeable as evidence by MOB's responses to CSW's questions.  FOB communicated to CSW "yall people need to stop coming in here talking about babies dying.  This shit should have been covered before she had the baby." CSW again apologized to FOB and explained to FOB the importance of education for SIDS.  CSW thankd MOB and FOB for allowing CSW to meet with them.  MOB and FOB had no additional questions or concerns at this time.   CSW Plan/Description:  Patient/Family Education , No Further Intervention Required/No Barriers to Discharge, Information/Referral to [facility], MSW, [facility] Clinical Social Work [phone]    Dimple Nanas, LCSW 03/29/2016, 12:08 PM

## 2016-03-29 NOTE — Lactation Note (Signed)
This note was copied from a baby's chart. Lactation Consultation Note  Patient Name: Amanda [name] [id number] Date: 03/29/2016 Reason for consult: Follow-up assessment Follow up visit made.  Baby is currently latched well to breat and nursing actively.  Mom states baby has been cluster feeding and nipples are sore.  Nipples intact with no redness noted.  Instructed to use EBM and coconut oil after feeds.  Reminded to use breast massage during feeding to increase the flow of milk.  Encouraged to call with concerns/assist.  Maternal Data    Feeding Feeding Type: Breast Fed Length of feed: 10 min  LATCH Score/Interventions Latch: Grasps breast easily, tongue down, lips flanged, rhythmical sucking. Intervention(s): Breast massage  Audible Swallowing: A few with stimulation  Type of Nipple: Everted at rest and after stimulation  Comfort (Breast/Nipple): Filling, red/small blisters or bruises, mild/mod discomfort     Hold (Positioning): No assistance needed to correctly position infant at breast.  LATCH Score: 8  Lactation Tools Discussed/Used     Consult Status Consult Status: Follow-up Date: 03/30/16 Follow-up type: In-patient    Ave Filter 03/29/2016, 2:13 PM

## 2016-03-30 LAB — GLUCOSE, CAPILLARY: GLUCOSE-CAPILLARY: 99 mg/dL (ref 65–99)

## 2016-03-30 MED ORDER — DOCUSATE SODIUM 100 MG PO CAPS: 100.0000 mg | ORAL_CAPSULE | Freq: Two times a day (BID) | ORAL | 0 refills | Status: DC

## 2016-03-30 MED ORDER — IBUPROFEN 600 MG PO TABS
600.0000 mg | ORAL_TABLET | Freq: Four times a day (QID) | ORAL | 0 refills | Status: DC | PRN
Start: 2016-03-30 — End: 2016-05-25

## 2016-03-30 NOTE — Lactation Note (Signed)
This note was copied from a baby's chart. Lactation Consultation Note  Baby latched in football hold upon entering on L side. Sucks and some swallows observed. Helped mother achieve a deeper latch - keep in close. Demonstrated how to hand express on R side to help w/ nipple soreness. Reviewed engorgement care and monitoring voids/stools. Mom encouraged to feed baby 8-12 times/24 hours and with feeding cues.    Patient Name: Amanda [name] [id number] Date: 03/30/2016     Maternal Data    Feeding Feeding Type: Breast Fed Length of feed: 30 min  LATCH Score/Interventions Latch: Grasps breast easily, tongue down, lips flanged, rhythmical sucking. (latched upon entering) Intervention(s): Assist with latch  Audible Swallowing: A few with stimulation Intervention(s): Hand expression  Type of Nipple: Everted at rest and after stimulation  Comfort (Breast/Nipple): Filling, red/small blisters or bruises, mild/mod discomfort  Problem noted: Mild/Moderate discomfort Interventions (Mild/moderate discomfort): Hand expression  Hold (Positioning): No assistance needed to correctly position infant at breast.  LATCH Score: 8  Lactation Tools Discussed/Used     Consult Status Consult Status: Complete    Carlye Grippe 03/30/2016, 10:26 AM

## 2016-03-30 NOTE — Discharge Instructions (Signed)

## 2016-03-30 NOTE — Discharge Summary (Signed)
OB Discharge Summary     Patient Name: Amanda [name] DOB: 11-02-1985 MRN: [id number]  Date of admission: 03/28/2016 Delivering MD: [name] MICHAEL   Date of discharge: 03/30/2016  Admitting diagnosis: 19 WEEKS ROM CTX Intrauterine pregnancy: [redacted]w[redacted]d     Secondary diagnosis:  Active Problems:   ROM (rupture of membranes), premature  Additional problems: none     Discharge diagnosis: Term Pregnancy Delivered and GDM A1                                                                                                Post partum procedures:none  Augmentation: none  Complications: None  Hospital course:  Onset of Labor With Vaginal Delivery     30 y.o. yo [id number] at [redacted]w[redacted]d was admitted in Active Labor on 03/28/2016. Patient had an uncomplicated labor course as follows:  Membrane Rupture Time/Date: 1:00 AM ,03/28/2016   Intrapartum Procedures: Episiotomy: None [1]                                         Lacerations:  3rd degree [4]  Patient had a delivery of a Viable infant. 03/28/2016  Information for the patient's newborn:  Caela, Shannon [id number]  Delivery Method: VBAC, Spontaneous (Filed from Delivery Summary)    Pateint had an uncomplicated postpartum course.  She is ambulating, tolerating a regular diet, passing flatus, and urinating well. Patient is discharged home in stable condition on 03/30/16.    Physical exam  Vitals:   03/28/16 1930 03/29/16 0538 03/29/16 1842 03/30/16 0605  BP: 104/62 91/67 104/66 (!) 84/53  Pulse: 91 95 96 92  Resp:  18 18 18   Temp: 98.2 F (36.8 C) 98.2 F (36.8 C) 98.2 F (36.8 C) 98.4 F (36.9 C)  TempSrc: Oral  Oral Oral  Weight:      Height:       General: alert, cooperative and no distress Lochia: appropriate Uterine Fundus: firm Incision: N/A DVT Evaluation: No evidence of DVT seen on physical exam. Labs: Lab Results  Component Value Date   WBC 8.3 03/28/2016   HGB 11.2 (L) 03/28/2016   HCT 32.5 (L) 03/28/2016   MCV  85.8 03/28/2016   PLT 136 (L) 03/28/2016   CMP Latest Ref Rng & Units 02/21/2016  Glucose 65 - 104 mg/dL 83  BUN 6 - 20 mg/dL -  Creatinine 0.44 - 1.00 mg/dL -  Sodium 135 - 145 mmol/L -  Potassium 3.5 - 5.1 mmol/L -  Chloride 101 - 111 mmol/L -  CO2 22 - 32 mmol/L -  Calcium 8.9 - 10.3 mg/dL -  Total Protein 6.5 - 8.1 g/dL -  Total Bilirubin 0.3 - 1.2 mg/dL -  Alkaline Phos 38 - 126 U/L -  AST 15 - 41 U/L -  ALT 14 - 54 U/L -    Discharge instruction: per After Visit Summary and "Baby and Me Booklet".  After visit meds:    Medication List    STOP taking these medications  valACYclovir 1000 MG tablet Commonly known as:  VALTREX     TAKE these medications   docusate sodium 100 MG capsule Commonly known as:  COLACE Take 1 capsule (100 mg total) by mouth 2 (two) times daily.   ibuprofen 600 MG tablet Commonly known as:  ADVIL,MOTRIN Take 1 tablet (600 mg total) by mouth every 6 (six) hours as needed for mild pain or moderate pain.   prenatal multivitamin Tabs tablet Take 1 tablet by mouth at bedtime.       Diet: routine diet  Activity: Advance as tolerated. Pelvic rest for 6 weeks.   Outpatient follow up:6 weeks Follow up Appt:No future appointments. Follow up Visit:No Follow-up on file.  Postpartum contraception: Condoms  Newborn Data: Live born female  Birth Weight: 7 lb 12.3 oz (3525 g) APGAR: 8, 9  Baby Feeding: Breast Disposition:home with mother   03/30/2016 Ralene Ok, MD   OB FELLOW DISCHARGE ATTESTATION  I have seen and examined this patient and agree with above documentation in the resident's note.   Katherine Basset, DO OB Fellow 11:56 PM

## 2016-04-06 ENCOUNTER — Telehealth (HOSPITAL_COMMUNITY): Payer: Self-pay | Admitting: Lactation Services

## 2016-04-06 NOTE — Telephone Encounter (Signed)
Mom reports baby stopped latching to breast yesterday around 5:00 pm. Baby did not breastfeed or having a feeding till today after Peds visit. Mom took baby to Peds and was advised to start formula 2 oz every 4 hours per Mom. Once baby's weight improves and breast soreness improves then Mom can work on re-latching baby. Mom reports nipples are sore and breasts are hard today. Discussed engorgement care - advised Mom to get into warm shower, use massage and moist heat to get milk flow moving, then pump to soften breasts, follow with ice packs. Continue this routine every 2-3 hours till engorgement resolves. Once engorgement resolves baby may be able to latch, Mom can try but needs to continue to supplement till has f/u with Peds and lactation. Mom has OP Lactation appointment scheduled for Wednesday, 04/12/16 at 1300. Advised to be sure to feed baby every 2-3 hours, continue supplements and pump to protect milk supply. Peds f/u is Thursday. For sore nipples apply EBM. Use EBM to supplement and add formula as needed to make 2 oz for each feeding. Advised of support group and times, call for questions/concerns.

## 2016-04-07 ENCOUNTER — Ambulatory Visit (INDEPENDENT_AMBULATORY_CARE_PROVIDER_SITE_OTHER): Payer: Medicaid Other | Admitting: Obstetrics & Gynecology

## 2016-04-07 VITALS — BP 111/82 | HR 107 | Wt 171.3 lb

## 2016-04-07 DIAGNOSIS — O9122 Nonpurulent mastitis associated with the puerperium: Secondary | ICD-10-CM

## 2016-04-07 MED ORDER — AMOXICILLIN 500 MG PO CAPS: 500.0000 mg | ORAL_CAPSULE | Freq: Three times a day (TID) | ORAL | 2 refills | Status: DC

## 2016-04-07 NOTE — Progress Notes (Signed)
Subjective:     Amanda [name] is a 30 y.o. female who presents to the clinic 1 weeks status post vaginal delivery and is nursing, has breast pain. Eating a regular diet without difficulty. Bowel movements are normal. Pain is controlled without any medications.  The following portions of the patient's history were reviewed and updated as appropriate: allergies, current medications, past family history, past medical history, past social history, past surgical history and problem list.  Review of Systems right breast pain and redness    Objective:    BP 111/82   Pulse (!) 107   Wt 171 lb 4.8 oz (77.7 kg)   LMP 06/29/2015   BMI 28.51 kg/m  General:  alert, cooperative and no distress          breast right inner quadrant red and tender no mass Assessment:   Post partum mastitis lactating  Plan:    1. Continue any current medications. 2. Amoxicillin 500 TID 7 days  3. Activity restrictions:continue nursing 4. Anticipated return to work: 4 weeks. 5. Follow up: 4 weeks pp visit  Woodroe Mode, MD 04/07/2016

## 2016-04-12 ENCOUNTER — Ambulatory Visit (HOSPITAL_COMMUNITY)
Admission: RE | Admit: 2016-04-12 | Discharge: 2016-04-12 | Disposition: A | Discharge status: Home / Self Care | Payer: Medicaid Other | Source: Ambulatory Visit | Attending: Obstetrics & Gynecology | Admitting: Obstetrics & Gynecology

## 2016-04-12 NOTE — Lactation Note (Signed)
Lactation Consult Weight today 8- 10.2  3916 g Great weight gain up 12 oz in 6 days since last Ped appointment. Mom reports breast feeding is going much better. Engorgement resolved, Mastitis better- encouraged to continue taking all antibiotics. Amanda [name] is latching much better- no pain with latch Encouragement given. No further questions at present. To call prn  Mother's reason for visit:  Was having trouble last week- baby stopped latching, engorgement and mastitiis Visit Type:  Feeding assessment  Consult:  Initial Lactation Consultant:  Linus Mako D  ________________________________________________________________________  Sharene Skeans Name:  Amanda [name] Date of Birth:  03/28/2015 Pediatrician:  [facility] Gender:  female Gestational Age: [redacted]w[redacted]d (At Birth) Birth Weight:  7 lb 12.3 oz (3525 g) Weight at Discharge:  Weight: 7 lb 4.8 oz (3310 g)                                   Date of Discharge:  03/30/2016      Filed Weights   03/28/16 0410 03/28/16 2324 03/29/16 2300  Weight: 7 lb 12.3 oz (3525 g) 7 lb 10.2 oz (3465 g) 7 lb 4.8 oz (3310 g)     ________________________________________________________________________  Mother's Name: Amanda [name] Type of delivery:   Breastfeeding Experience:  P2 but did not breast feed her first Maternal Medications:  Mom on antibiotics for mastitis   ________________________________________________________________________  Breastfeeding History (Post Discharge)  Frequency of breastfeeding:  q 2-3 hours Duration of feeding:  20-30 min  Supplementation  Formula:  Volume  None since last week        Breastmilk:  Volume 2-3 oz Frequency:  Once/day   Method:  Bottle,   Pumping  Type of pump:  Evenflo Frequency:  As needed if too full and baby doesn't soften Volume:  1-2 oz    Infant Intake and Output Assessment  Voids:  6-8 in 24 hrs.  Color:  Clear yellow Stools:  6-8 in 24 hrs.  Color:  Brown and  Yellow  ________________________________________________________________________  Maternal Breast Assessment  Breast:  Filling Nipple:  Erect  _______________________________________________________________________ Feeding Assessment/Evaluation  Initial feeding assessment:  Infant's oral assessment:  WNL  Positioning:  Cradle Right breast  LATCH documentation:  Latch:  2 = Grasps breast easily, tongue down, lips flanged, rhythmical sucking.  Audible swallowing:  2 = Spontaneous and intermittent  Type of nipple:  2 = Everted at rest and after stimulation  Comfort (Breast/Nipple):  1 = Filling, red/small blisters or bruises, mild/mod discomfort  Hold (Positioning):  2 = No assistance needed to correctly position infant at breast  LATCH score:  9  Attached assessment:  Deep  Lips flanged:  Yes.    Lips untucked:  Yes.    Suck assessment:  Nutritive   Pre-feed weight:  3916 g 8- 10.2 oz Post-feed weight:  3960 g  8- 11.7 oz Amount transferred:  44 ml Amount supplemented:  0 ml  Amanda [name] latched well to right breast. Lots of audible swallows noted. Mom reports breast feels softer. He nursed for 15 min, then going off to sleep   Pre-feed weight:  3960 g   8- 11.7 oz Post-feed weight:  3980 g  8- 12.4 oz Amount transferred:  20 ml  Again Amanda [name] latched well with lots of audible swallows. Mom reports he is doing much better. After diaper change he nursed for another 10 min but I did not reweigh  because I had not weighed again after diaper change.   Total amount pumped post feed: mom did not bring pump  Total amount transferred:  64+  ml Total supplement given:  Had 1 oz before coming to appointment

## 2016-04-14 ENCOUNTER — Encounter: Payer: Self-pay | Admitting: *Deleted

## 2016-05-25 ENCOUNTER — Ambulatory Visit (INDEPENDENT_AMBULATORY_CARE_PROVIDER_SITE_OTHER): Payer: Medicaid Other | Admitting: Family Medicine

## 2016-05-25 ENCOUNTER — Encounter: Payer: Self-pay | Admitting: Family Medicine

## 2016-05-25 DIAGNOSIS — Z8632 Personal history of gestational diabetes: Secondary | ICD-10-CM

## 2016-05-25 NOTE — Progress Notes (Signed)
Subjective:     Amanda [name] is a 30 y.o. female who presents for a postpartum visit. She is 8 weeks postpartum following a spontaneous vaginal delivery. I have fully reviewed the prenatal and intrapartum course. The delivery was at  98 gestational weeks. Outcome: spontaneous vaginal delivery. Anesthesia: none. Postpartum course has been uncomplicated . Baby's course has been uncomplicated. Baby is feeding by breast. Bleeding not at this time. Bowel function is normal. Bladder function is normal . Patient is not  sexually active. Contraception none at this time Postpartum depression screening:negative   Review of Systems Pertinent items noted in HPI and remainder of comprehensive ROS otherwise negative.   Objective:    BP 129/80   Pulse 90   Wt 162 lb (73.5 kg)   BMI 26.96 kg/m   General:  alert, cooperative and appears stated age  Lungs: normal effort  Heart:  regular rate and rhythm  Abdomen: soft, non-tender; bowel sounds normal; no masses,  no organomegaly        Assessment:     Normal postpartum exam. Pap smear not done at today's visit.   Plan:    1. Contraception: coitus interruptus and condoms 2. Pap due 2020 3. Follow up in: 2 weeks for 2 hour GCT or as needed.

## 2016-05-25 NOTE — Patient Instructions (Signed)
Contraception Choices Contraception (birth control) is the use of any methods or devices to prevent pregnancy. Below are some methods to help avoid pregnancy. HORMONAL METHODS   Contraceptive implant. This is a thin, plastic tube containing progesterone hormone. It does not contain estrogen hormone. Your health care provider inserts the tube in the inner part of the upper arm. The tube can remain in place for up to 3 years. After 3 years, the implant must be removed. The implant prevents the ovaries from releasing an egg (ovulation), thickens the cervical mucus to prevent sperm from entering the uterus, and thins the lining of the inside of the uterus.  Progesterone-only injections. These injections are given every 3 months by your health care provider to prevent pregnancy. This synthetic progesterone hormone stops the ovaries from releasing eggs. It also thickens cervical mucus and changes the uterine lining. This makes it harder for sperm to survive in the uterus.  Birth control pills. These pills contain estrogen and progesterone hormone. They work by preventing the ovaries from releasing eggs (ovulation). They also cause the cervical mucus to thicken, preventing the sperm from entering the uterus. Birth control pills are prescribed by a health care provider.Birth control pills can also be used to treat heavy periods.  Minipill. This type of birth control pill contains only the progesterone hormone. They are taken every day of each month and must be prescribed by your health care provider.  Birth control patch. The patch contains hormones similar to those in birth control pills. It must be changed once a week and is prescribed by a health care provider.  Vaginal ring. The ring contains hormones similar to those in birth control pills. It is left in the vagina for 3 weeks, removed for 1 week, and then a new one is put back in place. The patient must be comfortable inserting and removing the ring  from the vagina.A health care provider's prescription is necessary.  Emergency contraception. Emergency contraceptives prevent pregnancy after unprotected sexual intercourse. This pill can be taken right after sex or up to 5 days after unprotected sex. It is most effective the sooner you take the pills after having sexual intercourse. Most emergency contraceptive pills are available without a prescription. Check with your pharmacist. Do not use emergency contraception as your only form of birth control. BARRIER METHODS   Female condom. This is a thin sheath (latex or rubber) that is worn over the penis during sexual intercourse. It can be used with spermicide to increase effectiveness.  Female condom. This is a soft, loose-fitting sheath that is put into the vagina before sexual intercourse.  Diaphragm. This is a soft, latex, dome-shaped barrier that must be fitted by a health care provider. It is inserted into the vagina, along with a spermicidal jelly. It is inserted before intercourse. The diaphragm should be left in the vagina for 6 to 8 hours after intercourse.  Cervical cap. This is a round, soft, latex or plastic cup that fits over the cervix and must be fitted by a health care provider. The cap can be left in place for up to 48 hours after intercourse.  Sponge. This is a soft, circular piece of polyurethane foam. The sponge has spermicide in it. It is inserted into the vagina after wetting it and before sexual intercourse.  Spermicides. These are chemicals that kill or block sperm from entering the cervix and uterus. They come in the form of creams, jellies, suppositories, foam, or tablets. They do not require a   prescription. They are inserted into the vagina with an applicator before having sexual intercourse. The process must be repeated every time you have sexual intercourse. INTRAUTERINE CONTRACEPTION  Intrauterine device (IUD). This is a T-shaped device that is put in a woman's uterus  during a menstrual period to prevent pregnancy. There are 2 types:  Copper IUD. This type of IUD is wrapped in copper wire and is placed inside the uterus. Copper makes the uterus and fallopian tubes produce a fluid that kills sperm. It can stay in place for 10 years.  Hormone IUD. This type of IUD contains the hormone progestin (synthetic progesterone). The hormone thickens the cervical mucus and prevents sperm from entering the uterus, and it also thins the uterine lining to prevent implantation of a fertilized egg. The hormone can weaken or kill the sperm that get into the uterus. It can stay in place for 3-5 years, depending on which type of IUD is used. PERMANENT METHODS OF CONTRACEPTION  Female tubal ligation. This is when the woman's fallopian tubes are surgically sealed, tied, or blocked to prevent the egg from traveling to the uterus.  Hysteroscopic sterilization. This involves placing a small coil or insert into each fallopian tube. Your doctor uses a technique called hysteroscopy to do the procedure. The device causes scar tissue to form. This results in permanent blockage of the fallopian tubes, so the sperm cannot fertilize the egg. It takes about 3 months after the procedure for the tubes to become blocked. You must use another form of birth control for these 3 months.  Female sterilization. This is when the female has the tubes that carry sperm tied off (vasectomy).This blocks sperm from entering the vagina during sexual intercourse. After the procedure, the man can still ejaculate fluid (semen). NATURAL PLANNING METHODS  Natural family planning. This is not having sexual intercourse or using a barrier method (condom, diaphragm, cervical cap) on days the woman could become pregnant.  Calendar method. This is keeping track of the length of each menstrual cycle and identifying when you are fertile.  Ovulation method. This is avoiding sexual intercourse during ovulation.  Symptothermal  method. This is avoiding sexual intercourse during ovulation, using a thermometer and ovulation symptoms.  Post-ovulation method. This is timing sexual intercourse after you have ovulated. Regardless of which type or method of contraception you choose, it is important that you use condoms to protect against the transmission of sexually transmitted infections (STIs). Talk with your health care provider about which form of contraception is most appropriate for you.   This information is not intended to replace advice given to you by your health care provider. Make sure you discuss any questions you have with your health care provider.   Document Released: 08/14/2005 Document Revised: 08/19/2013 Document Reviewed: 02/06/2013 Elsevier Interactive Patient Education 2016 [employer]  

## 2016-05-30 ENCOUNTER — Other Ambulatory Visit: Payer: Medicaid Other

## 2016-09-03 IMAGING — CR DG ABDOMEN ACUTE W/ 1V CHEST
4 series · 4 of 4 positions shown · non-contrast
Comparison: None.

CLINICAL DATA: Low back and pelvic pain.  Nausea.

EXAM:
DG ABDOMEN ACUTE W/ 1V CHEST

[chest pa]
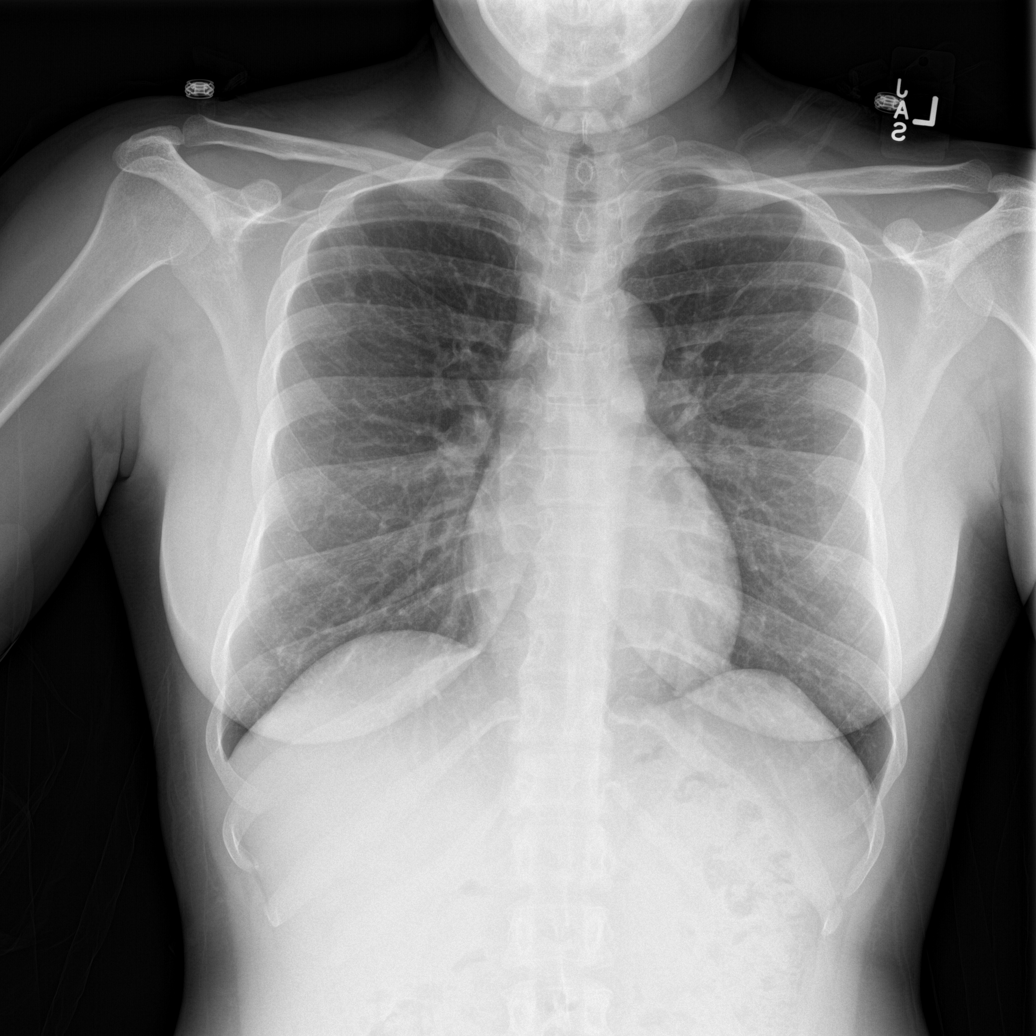

[abdomen erect]
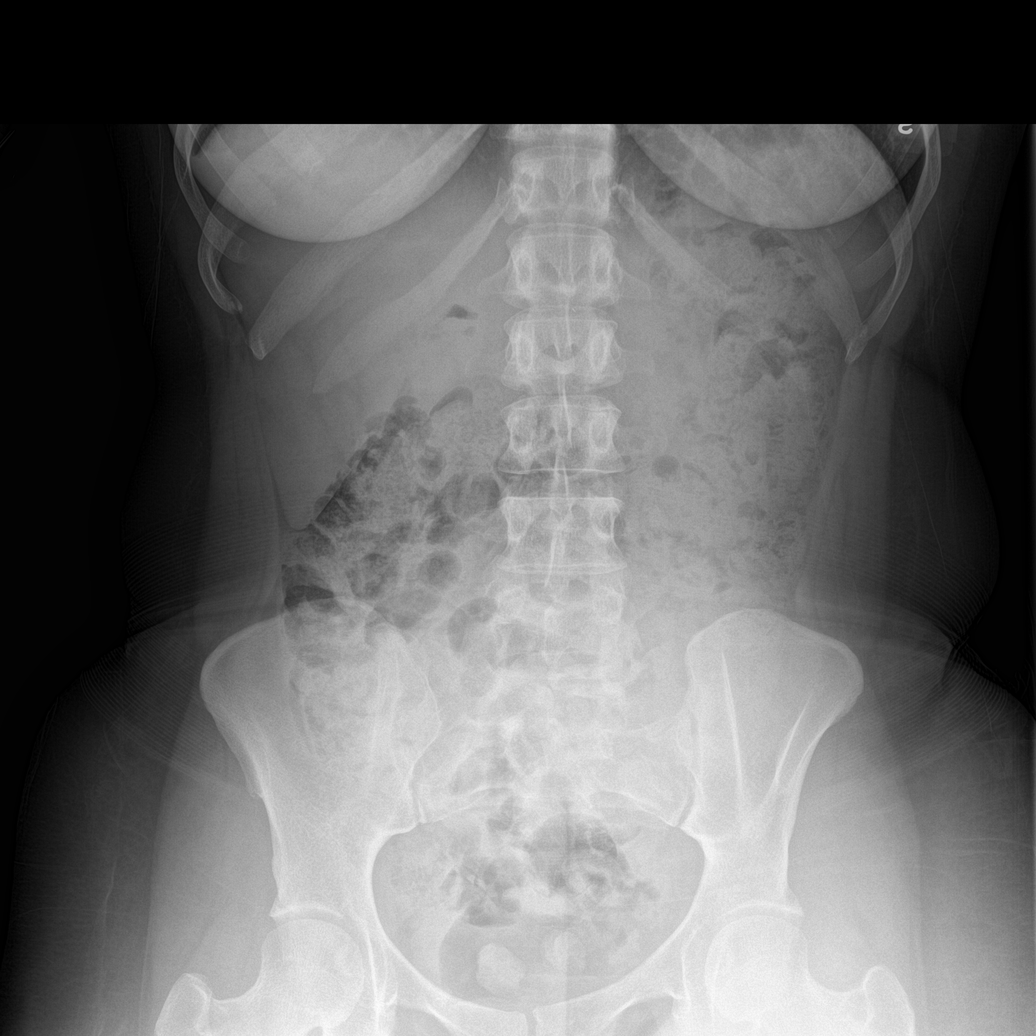

[abdomen supine (1 of 2)]
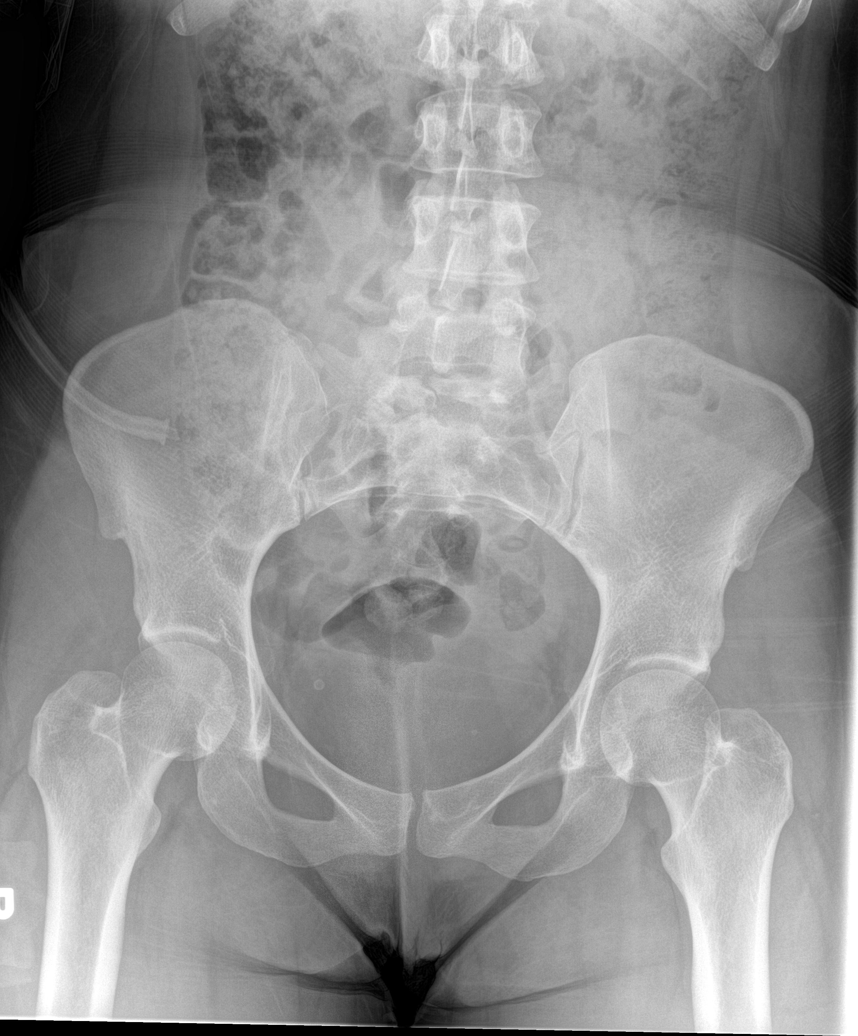

[abdomen supine (2 of 2)]
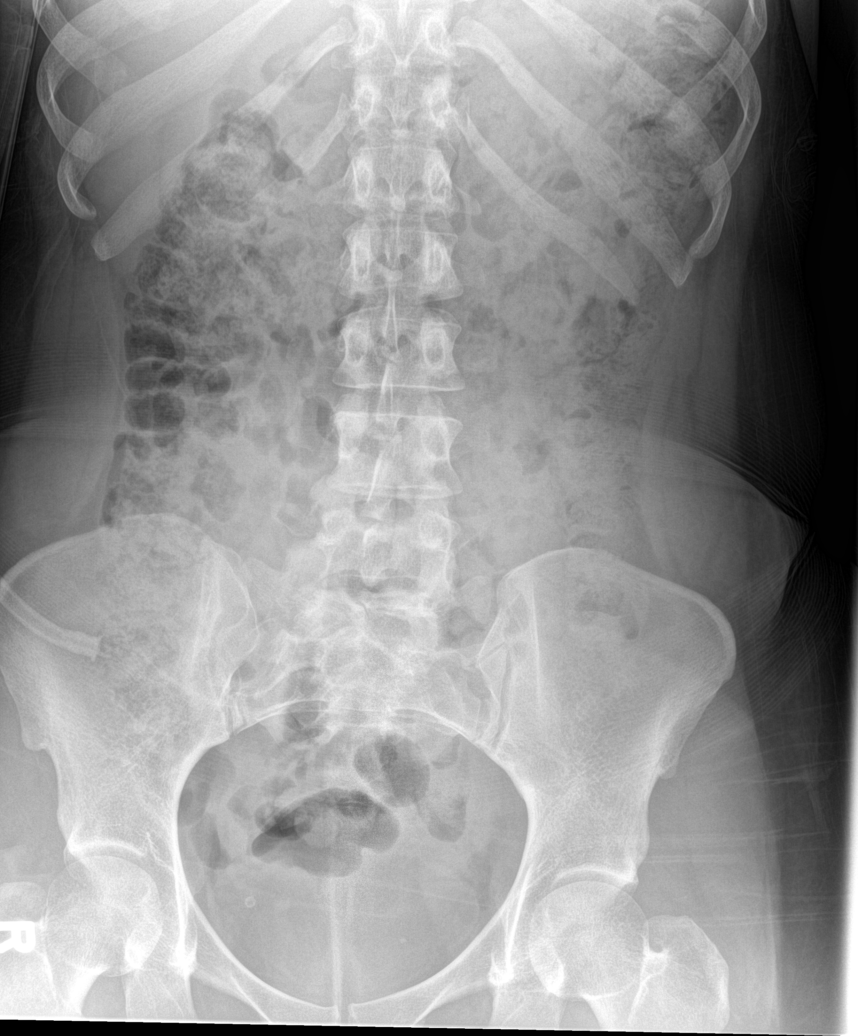

[4 of 4 positions shown; findings below may reference images not displayed]

FINDINGS: The cardiomediastinal contours are normal. The lungs are clear.
There is no free intra-abdominal air. No dilated bowel loops to
suggest obstruction. Moderate volume of stool throughout the entire
colon. No radiopaque calculi. There is a phlebolith in the pelvis.
No acute osseous abnormalities are seen. There is transitional
lumbosacral anatomy.
IMPRESSION: 1. Moderate volume of colonic stool, suggestive of constipation. No
bowel obstruction.
2. Clear lungs.

## 2017-08-28 NOTE — L&D Delivery Note (Signed)
Patient arrived to MAU 9cm/100/SROM. Patient moved to labor and delivery room attended by MAU provider. She had rapid progress to NSVD.   Delivery Note At 8:11 AM a viable female was delivered via Vaginal, Spontaneous (Presentation: LOA;  ).  APGAR: 9, 9; weight  pening.   Placenta status:complete ,intact .  Cord: 3V  with the following complications: none .  Cord pH: NA  Anesthesia:  Local  Episiotomy: None Lacerations: 2nd degree Suture Repair: 3.0 monocryl  Est. Blood Loss (mL): 200  Mom to postpartum.  Baby to Couplet care / Skin to Skin.  Marcille Buffy 07/02/2018, 8:41 AM

## 2017-11-05 ENCOUNTER — Ambulatory Visit: Payer: Self-pay

## 2017-11-06 ENCOUNTER — Encounter: Payer: Self-pay | Admitting: General Practice

## 2017-11-06 ENCOUNTER — Ambulatory Visit (INDEPENDENT_AMBULATORY_CARE_PROVIDER_SITE_OTHER): Payer: Medicaid Other

## 2017-11-06 DIAGNOSIS — Z3201 Encounter for pregnancy test, result positive: Secondary | ICD-10-CM | POA: Diagnosis present

## 2017-11-06 DIAGNOSIS — Z3480 Encounter for supervision of other normal pregnancy, unspecified trimester: Secondary | ICD-10-CM

## 2017-11-06 LAB — POCT PREGNANCY, URINE: Preg Test, Ur: POSITIVE — AB

## 2017-11-06 NOTE — Progress Notes (Signed)
Pt presented to the office for a UPT. UPT positive.Pt states that she thinks that her LMP was 10/02/17 but feels that she is further along. I scheduled a [place] to confirm dating because  I was unable to get heart tones. Pt informed that she could be too early. Pt is 5w 0d with an EDD of 07/09/18 by LMP. [place] is scheduled for 11/09/17 at 10am.Pt advised to continue taking prenatal vitamins and to start prenatal care.Pt verbalized understanding and had no questions.

## 2017-11-06 NOTE — Progress Notes (Signed)
Chart reviewed for nurse visit. Agree with plan of care.   Katheren Shams, DO 11/06/2017 1:07 PM

## 2017-11-09 ENCOUNTER — Ambulatory Visit (HOSPITAL_COMMUNITY)
Admission: RE | Admit: 2017-11-09 | Discharge: 2017-11-09 | Disposition: A | Discharge status: Home / Self Care | Payer: Medicaid Other | Source: Ambulatory Visit | Attending: Obstetrics and Gynecology | Admitting: Obstetrics and Gynecology

## 2017-11-09 DIAGNOSIS — Z3480 Encounter for supervision of other normal pregnancy, unspecified trimester: Secondary | ICD-10-CM

## 2017-11-09 DIAGNOSIS — Z3A01 Less than 8 weeks gestation of pregnancy: Secondary | ICD-10-CM | POA: Insufficient documentation

## 2017-11-09 DIAGNOSIS — D573 Sickle-cell trait: Secondary | ICD-10-CM | POA: Diagnosis not present

## 2017-11-09 DIAGNOSIS — O3411 Maternal care for benign tumor of corpus uteri, first trimester: Secondary | ICD-10-CM | POA: Diagnosis not present

## 2017-11-09 DIAGNOSIS — D251 Intramural leiomyoma of uterus: Secondary | ICD-10-CM | POA: Insufficient documentation

## 2017-11-09 DIAGNOSIS — Z3201 Encounter for pregnancy test, result positive: Secondary | ICD-10-CM

## 2017-11-09 DIAGNOSIS — O99011 Anemia complicating pregnancy, first trimester: Secondary | ICD-10-CM | POA: Diagnosis present

## 2017-11-13 ENCOUNTER — Encounter: Payer: Self-pay | Admitting: *Deleted

## 2017-11-13 ENCOUNTER — Ambulatory Visit (INDEPENDENT_AMBULATORY_CARE_PROVIDER_SITE_OTHER): Payer: Medicaid Other | Admitting: *Deleted

## 2017-11-13 ENCOUNTER — Other Ambulatory Visit (HOSPITAL_COMMUNITY)
Admission: RE | Admit: 2017-11-13 | Discharge: 2017-11-13 | Disposition: A | Discharge status: Home / Self Care | Payer: Medicaid Other | Source: Ambulatory Visit | Attending: Family Medicine | Admitting: Family Medicine

## 2017-11-13 VITALS — Ht 65.0 in | Wt 138.3 lb

## 2017-11-13 DIAGNOSIS — Z3A01 Less than 8 weeks gestation of pregnancy: Secondary | ICD-10-CM | POA: Insufficient documentation

## 2017-11-13 DIAGNOSIS — Z3480 Encounter for supervision of other normal pregnancy, unspecified trimester: Secondary | ICD-10-CM

## 2017-11-13 DIAGNOSIS — Z3481 Encounter for supervision of other normal pregnancy, first trimester: Secondary | ICD-10-CM | POA: Diagnosis present

## 2017-11-13 DIAGNOSIS — Z8632 Personal history of gestational diabetes: Secondary | ICD-10-CM

## 2017-11-13 NOTE — Progress Notes (Signed)
Pt states sure LMP 10/02/17 - EDD 07/09/18. This is consistent with US done 11/09/17 showing [redacted]w[redacted]d and EDD 07/07/18.  BEDD 07/09/18 by LMP.  Pt expressed concerns regarding breastfeeding due to she is currently still nursing her 27 month old child. She requests information regarding if still ok while pregnant and recommendations for weaning. Pt informed it is ok to breastfeed while pregnant. Suggestions provided for weaning after consult with Derinda Late, RNC,

## 2017-11-14 ENCOUNTER — Inpatient Hospital Stay (HOSPITAL_COMMUNITY)
Admission: AD | Admit: 2017-11-14 | Discharge: 2017-11-14 | Disposition: A | Discharge status: Home / Self Care | Payer: Medicaid Other | Source: Ambulatory Visit | Attending: Obstetrics and Gynecology | Admitting: Obstetrics and Gynecology

## 2017-11-14 DIAGNOSIS — D573 Sickle-cell trait: Secondary | ICD-10-CM | POA: Diagnosis not present

## 2017-11-14 DIAGNOSIS — Z87891 Personal history of nicotine dependence: Secondary | ICD-10-CM | POA: Diagnosis not present

## 2017-11-14 DIAGNOSIS — Z3A01 Less than 8 weeks gestation of pregnancy: Secondary | ICD-10-CM | POA: Insufficient documentation

## 2017-11-14 DIAGNOSIS — Z79899 Other long term (current) drug therapy: Secondary | ICD-10-CM | POA: Insufficient documentation

## 2017-11-14 DIAGNOSIS — A084 Viral intestinal infection, unspecified: Secondary | ICD-10-CM | POA: Diagnosis not present

## 2017-11-14 DIAGNOSIS — O26891 Other specified pregnancy related conditions, first trimester: Secondary | ICD-10-CM | POA: Insufficient documentation

## 2017-11-14 DIAGNOSIS — O99111 Other diseases of the blood and blood-forming organs and certain disorders involving the immune mechanism complicating pregnancy, first trimester: Secondary | ICD-10-CM | POA: Diagnosis not present

## 2017-11-14 LAB — OBSTETRIC PANEL, INCLUDING HIV
Antibody Screen: NEGATIVE
BASOS ABS: 0 10*3/uL (ref 0.0–0.2)
Basos: 0 %
EOS (ABSOLUTE): 0.1 10*3/uL (ref 0.0–0.4)
Eos: 2 %
HIV SCREEN 4TH GENERATION: NONREACTIVE
Hematocrit: 35.7 % (ref 34.0–46.6)
Hemoglobin: 12.1 g/dL (ref 11.1–15.9)
Hepatitis B Surface Ag: NEGATIVE
Immature Grans (Abs): 0 10*3/uL (ref 0.0–0.1)
Immature Granulocytes: 0 %
Lymphocytes Absolute: 1.1 10*3/uL (ref 0.7–3.1)
Lymphs: 28 %
MCH: 28.8 pg (ref 26.6–33.0)
MCHC: 33.9 g/dL (ref 31.5–35.7)
MCV: 85 fL (ref 79–97)
Monocytes Absolute: 0.2 10*3/uL (ref 0.1–0.9)
Monocytes: 6 %
NEUTROS ABS: 2.6 10*3/uL (ref 1.4–7.0)
Neutrophils: 64 %
PLATELETS: 252 10*3/uL (ref 150–379)
RBC: 4.2 x10E6/uL (ref 3.77–5.28)
RDW: 13.3 % (ref 12.3–15.4)
RPR Ser Ql: NONREACTIVE
Rh Factor: POSITIVE
Rubella Antibodies, IGG: 1.73 index (ref >0.99)
WBC: 4 10*3/uL (ref 3.4–10.8)

## 2017-11-14 LAB — URINALYSIS, ROUTINE W REFLEX MICROSCOPIC
Bilirubin Urine: NEGATIVE
Glucose, UA: NEGATIVE mg/dL
Hgb urine dipstick: NEGATIVE
Ketones, ur: NEGATIVE mg/dL
Leukocytes, UA: NEGATIVE
Nitrite: NEGATIVE
Protein, ur: NEGATIVE mg/dL
Specific Gravity, Urine: 1.013 (ref 1.005–1.030)
pH: 5 (ref 5.0–8.0)

## 2017-11-14 LAB — GC/CHLAMYDIA PROBE AMP (~~LOC~~) NOT AT ARMC
Chlamydia: NEGATIVE
Neisseria Gonorrhea: NEGATIVE

## 2017-11-14 LAB — HEMOGLOBIN A1C
ESTIMATED AVERAGE GLUCOSE: 97 mg/dL
Hgb A1c MFr Bld: 5 % (ref 4.8–5.6)

## 2017-11-14 MED ORDER — PROMETHAZINE HCL 25 MG PO TABS: 25.0000 mg | ORAL_TABLET | Freq: Once | ORAL | Status: DC

## 2017-11-14 MED ORDER — PROMETHAZINE HCL 25 MG/ML IJ SOLN
25.0000 mg | Freq: Once | INTRAMUSCULAR | Status: AC
Start: 2017-11-14 — End: 2017-11-14
  Administered 2017-11-14: 25 mg via INTRAMUSCULAR
  Filled 2017-11-14: qty 1

## 2017-11-14 MED ORDER — PROMETHAZINE HCL 12.5 MG PO TABS: 12.5000 mg | ORAL_TABLET | Freq: Four times a day (QID) | ORAL | 3 refills | Status: DC | PRN

## 2017-11-14 NOTE — MAU Note (Signed)
Pt c/o vomiting and diarrhea that started at 0300. Pt states both her daughter and son had the stomach bug over the weekend. Pt reports some lower abdominal cramping. Denies bleeding.

## 2017-11-14 NOTE — Discharge Instructions (Signed)

## 2017-11-14 NOTE — MAU Note (Signed)
Pt reporting vomiting and diarrhea since 2200 last night.Denies fever.

## 2017-11-14 NOTE — MAU Provider Note (Signed)
Chief Complaint: Emesis and Diarrhea   First Provider Initiated Contact with Patient 11/14/17 0551      SUBJECTIVE HPI: Amanda [name] is a 32 y.o. [id number] at [redacted]w[redacted]d by LMP who presents to maternity admissions reporting emesis and diarrhea. She reports symptoms started suddenly last night around 10pm. She reports her stools were loose then became watery this morning prior to arrival. She reports 4 occurrences of diarrhea and vomiting 3-4 times throughout the night. She reports that both of her children had the stomach bug over the weekend. She reports abdominal cramping last night when she first began to vomiting and have diarrhea but denies current abdominal pain.  She denies vaginal bleeding, vaginal itching/burning, urinary symptoms, h/a, dizziness or fever/chills.     Past Medical History:  Diagnosis Date  . Anemia   . Gestational diabetes    Gestational DM, diet controlled  . Hypoglycemia   . Sickle cell trait [facility])    Past Surgical History:  Procedure Laterality Date  . CESAREAN SECTION  07/09/2004   FTP  . DILATION AND CURETTAGE OF UTERUS    . HERNIA REPAIR    . UMBILICAL HERNIA REPAIR     Social History   Socioeconomic History  . Marital status: Married    Spouse name: Not on file  . Number of children: Not on file  . Years of education: Not on file  . Highest education level: Not on file  Social Needs  . Financial resource strain: Not on file  . Food insecurity - worry: Not on file  . Food insecurity - inability: Not on file  . Transportation needs - medical: Not on file  . Transportation needs - non-medical: Not on file  Occupational History  . Not on file  Tobacco Use  . Smoking status: Former Smoker    Last attempt to quit: 11/18/2009    Years since quitting: 7.9  . Smokeless tobacco: Never Used  Substance and Sexual Activity  . Alcohol use: [name]  . Drug use: [name]  . Sexual activity: Yes    Birth control/protection: None  Other Topics Concern  . Not on file   Social History Narrative  . Not on file   [name] current facility-administered medications on file prior to encounter.    Current Outpatient Medications on File Prior to Encounter  Medication Sig Dispense Refill  . Prenatal Vit-Fe Fumarate-FA (PRENATAL MULTIVITAMIN) TABS tablet Take 1 tablet by mouth at bedtime.     [name] Known Allergies  ROS:  Review of Systems  Constitutional: Negative.   Respiratory: Negative.   Cardiovascular: Negative.   Gastrointestinal: Positive for abdominal pain, diarrhea, nausea and vomiting.       [name] abdominal pain currently  Genitourinary: Negative.   Musculoskeletal: Negative.    I have reviewed patient's Past Medical Hx, Surgical Hx, Family Hx, Social Hx, medications and allergies.   Physical Exam   Patient Vitals for the past 24 hrs:  BP Temp Temp src Pulse Resp Height Weight  11/14/17 0640 109/65 - - - - - -  11/14/17 0510 120/79 98.6 F (37 C) Oral (!) 104 16 - -  11/14/17 0500 - - - - - 5\' 5"  (1.651 m) 140 lb (63.5 kg)   Constitutional: Well-developed, well-nourished female in [name] acute distress.  Cardiovascular: normal rate Respiratory: normal effort GI: Abd soft, tender in epigastric region. Pos BS x 4 MS: Extremities nontender, [name] edema, normal ROM Neurologic: Alert and oriented x 4.  GU: Neg CVAT. PELVIC EXAM: deferred  LAB RESULTS Results for orders placed or performed during the hospital encounter of 11/14/17 (from the past 24 hour(s))  Urinalysis, Routine w reflex microscopic     Status: None   Collection Time: 11/14/17  4:57 AM  Result Value Ref Range   Color, Urine YELLOW YELLOW   APPearance CLEAR CLEAR   Specific Gravity, Urine 1.013 1.005 - 1.030   pH 5.0 5.0 - 8.0   Glucose, UA NEGATIVE NEGATIVE mg/dL   Hgb urine dipstick NEGATIVE NEGATIVE   Bilirubin Urine NEGATIVE NEGATIVE   Ketones, ur NEGATIVE NEGATIVE mg/dL   Protein, ur NEGATIVE NEGATIVE mg/dL   Nitrite NEGATIVE NEGATIVE   Leukocytes, UA NEGATIVE NEGATIVE    IMAGING US Ob Transvaginal  Result Date: 11/09/2017 CLINICAL DATA:  First trimester pregnancy. High-risk pregnancy with sickle cell trait. EXAM: TRANSVAGINAL OB ULTRASOUND TECHNIQUE: Transvaginal ultrasound was performed for complete evaluation of the gestation as well as the maternal uterus, adnexal regions, and pelvic cul-de-sac. COMPARISON:  None. FINDINGS: Intrauterine gestational sac: Single Yolk sac:  Visualized. Embryo:  Visualized. Cardiac Activity: Visualized. Heart Rate: 108 bpm CRL:   3 mm   5 w 5 d                  [place] EDC: 07/07/2018 Subchorionic hemorrhage:  None visualized. Maternal uterus/adnexae: A small intramural fibroid is seen in the posterior fundus measuring 1.7 cm. Small right ovarian corpus luteum. Otherwise normal appearance of both ovaries. [name] mass or abnormal free fluid identified. IMPRESSION: Single living IUP measuring 5 weeks 5 days, with [place] EDC of 07/07/2018. 1.7 cm posterior fundal fibroid. Electronically Signed   By: Earle Gell M.D.   On: 11/09/2017 11:40    MAU Management/MDM: Orders Placed This Encounter  Procedures  . Urinalysis, Routine w reflex microscopic  UA- negative   Meds ordered this encounter  Medications  . DISCONTD: promethazine (PHENERGAN) tablet 25 mg  . promethazine (PHENERGAN) injection 25 mg   Treatments in MAU included Phenergan 25mg  IM due to patient actively vomiting on arrival to MAU, pt able to tolerate crackers and sprite prior to discharge from MAU. Phenergan Rx sent to pharmacy of choice for future use. Pt discharged. Pt stable prior to discharge.   ASSESSMENT 1. Viral gastroenteritis     PLAN Discharge home Follow up as scheduled for initial prenatal visit on 4/23 Return to MAU as needed for emergencies Rx for Phenergan    Allergies as of 11/14/2017   [name] Known Allergies     Medication List    TAKE these medications   prenatal multivitamin Tabs tablet Take 1 tablet by mouth at bedtime.   promethazine 12.5 MG  tablet Commonly known as:  PHENERGAN Take 1 tablet (12.5 mg total) by mouth every 6 (six) hours as needed for nausea or vomiting.       Darrol Poke  Certified Nurse-Midwife 11/14/2017  6:29 AM

## 2017-11-15 LAB — CULTURE, OB URINE

## 2017-11-15 LAB — URINE CULTURE, OB REFLEX

## 2017-11-20 LAB — SMN1 COPY NUMBER ANALYSIS (SMA CARRIER SCREENING)

## 2017-11-27 ENCOUNTER — Encounter: Payer: Self-pay | Admitting: *Deleted

## 2017-11-28 ENCOUNTER — Encounter: Payer: Self-pay | Admitting: *Deleted

## 2017-12-18 ENCOUNTER — Ambulatory Visit (INDEPENDENT_AMBULATORY_CARE_PROVIDER_SITE_OTHER): Payer: Medicaid Other | Admitting: Obstetrics and Gynecology

## 2017-12-18 ENCOUNTER — Other Ambulatory Visit: Payer: Medicaid Other

## 2017-12-18 VITALS — BP 112/50 | HR 90 | Ht <= 58 in | Wt 135.0 lb

## 2017-12-18 DIAGNOSIS — D573 Sickle-cell trait: Secondary | ICD-10-CM

## 2017-12-18 DIAGNOSIS — O34219 Maternal care for unspecified type scar from previous cesarean delivery: Secondary | ICD-10-CM

## 2017-12-18 DIAGNOSIS — Z3481 Encounter for supervision of other normal pregnancy, first trimester: Secondary | ICD-10-CM

## 2017-12-18 DIAGNOSIS — A609 Anogenital herpesviral infection, unspecified: Secondary | ICD-10-CM

## 2017-12-18 DIAGNOSIS — Z8632 Personal history of gestational diabetes: Secondary | ICD-10-CM

## 2017-12-18 DIAGNOSIS — Z3480 Encounter for supervision of other normal pregnancy, unspecified trimester: Secondary | ICD-10-CM

## 2017-12-18 LAB — GLUCOSE, CAPILLARY: GLUCOSE-CAPILLARY: 78 mg/dL (ref 65–99)

## 2017-12-18 NOTE — Progress Notes (Addendum)
Subjective:   Amanda [name] is a 32 y.o. [id number] at [redacted]w[redacted]d by LMP being seen today for her first obstetrical visit.  Her obstetrical history is significant for GDM: diet controlled A1, with previous pregnancy, HSV, Hgb A-S genotype. Patient does intend to breast feed. Pregnancy history fully reviewed. Currently breastfeeding her 39 month old 4-5 times per day. First pregnancy was a cesarean section, 2nd delivery was a successful TOLAC. Patient interested in water birth.    Patient reports nausea; has improved, not interested in medication at this time.   HISTORY: OB History  Gravida Para Term Preterm AB Living  6 2 2  0 3 2  SAB TAB Ectopic Multiple Live Births  1 1 1  0 2    # Outcome Date GA Lbr Len/2nd Weight Sex Delivery Anes PTL Lv  6 Current           5 Term 03/28/16 [redacted]w[redacted]d 02:45 / 00:25 7 lb 12.3 oz (3.525 kg) M VBAC None  LIV     Name: RICKIA, [place]     Apgar1: 8  Apgar5: 9  4 Ectopic 2015          3 SAB 2010 [redacted]w[redacted]d            Birth Comments: had D&c  2 TAB 2007          1 Term 2005 [redacted]w[redacted]d  7 lb 8 oz (3.402 kg) F CS-LTranv   LIV     Birth Comments: C/S because pelvis small, and unsure how long PROM    Last pap smear was done 2017 and was normal  Past Medical History:  Diagnosis Date  . Anemia   . Gestational diabetes    Gestational DM, diet controlled  . Hypoglycemia   . Sickle cell trait [facility])    Past Surgical History:  Procedure Laterality Date  . CESAREAN SECTION  07/09/2004   FTP  . DILATION AND CURETTAGE OF UTERUS    . HERNIA REPAIR    . UMBILICAL HERNIA REPAIR     Family History  Problem Relation Age of Onset  . Hyperlipidemia Mother   . Diabetes Father   . Hepatitis C Father   . Sickle cell anemia Father    Social History   Tobacco Use  . Smoking status: Former Smoker    Last attempt to quit: 11/18/2009    Years since quitting: 8.0  . Smokeless tobacco: Never Used  Substance Use Topics  . Alcohol use: No  . Drug use: No   No Known  Allergies Current Outpatient Medications on File Prior to Visit  Medication Sig Dispense Refill  . Prenatal Vit-Fe Fumarate-FA (PRENATAL MULTIVITAMIN) TABS tablet Take 1 tablet by mouth at bedtime.     No current facility-administered medications on file prior to visit.     Review of Systems Pertinent items noted in HPI and remainder of comprehensive ROS otherwise negative.  Exam   Vitals:   12/18/17 1048  BP: (!) 112/50  Pulse: 90  Weight: 135 lb (61.2 kg)   Fetal Heart Rate (bpm): 146  Uterus: Size Equal to dates.   System: General: well-developed, well-nourished female in no acute distress   Breast:  normal appearance, no masses or tenderness   Skin: normal coloration and turgor, no rashes   Neurologic: oriented, normal, negative, normal mood   Extremities: normal strength, tone, and muscle mass, ROM of all joints is normal   HEENT PERRLA, extraocular movement intact and sclera clear, anicteric  Mouth/Teeth mucous membranes moist, pharynx normal without lesions and dental hygiene good   Neck supple and no masses   Cardiovascular: regular rate and rhythm   Respiratory:  no respiratory distress, normal breath sounds   Abdomen: soft, non-tender; bowel sounds normal; no masses,  no organomegaly     Assessment:   Pregnancy: [id number] Patient Active Problem List   Diagnosis Date Noted  . History of cesarean delivery, currently pregnant 12/19/2017  . Supervision of other normal pregnancy, antepartum 11/13/2017  . History of gestational diabetes mellitus (GDM) 03/06/2016  . Anxiety state 11/18/2015  . Depression 11/18/2015  . 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 HSV (herpes simplex virus) anogenital infection 07/12/2014     Plan:  1. History of gestational diabetes mellitus (GDM)  Fasting CBC today 78 Will discuss ASA starting at 16 weeks.   2. Supervision of other normal pregnancy, antepartum  - Genetic Screening  3. 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  Declined any additional counseling.    4. HSV (herpes simplex virus) anogenital infection  valacyclovir at 35 weeks for suppression.    Initial labs drawn. Continue prenatal vitamins. Genetic Screening discussed, NIPS: requested. Ultrasound discussed; fetal anatomic survey: requested. Problem list reviewed and updated. The nature of [facility] with multiple MDs and other Advanced Practice Providers was explained to patient; also emphasized that residents, students are part of our team. Routine obstetric precautions reviewed. Return in about 1 month (around 01/15/2018).    Rasch, Artist Pais, [place] for [employer], [place]

## 2017-12-18 NOTE — Progress Notes (Signed)
FHR obtained via bedside ultrasound

## 2017-12-18 NOTE — Patient Instructions (Signed)
Considering Waterbirth? Guide for patients at [facility]  Why consider waterbirth?  . Gentle birth for babies . Less pain medicine used in labor . May allow for passive descent/less pushing . May reduce perineal tears  . More mobility and instinctive maternal position changes . Increased maternal relaxation . Reduced blood pressure in labor  Is waterbirth safe? What are the risks of infection, drowning or other complications?  . Infection: o Very low risk (3.7 % for tub vs 4.8% for bed) o 7 in 8000 waterbirths with documented infection o Poorly cleaned equipment most common cause o Slightly lower group B strep transmission rate  . Drowning o Maternal:  - Very low risk   - Related to seizures or fainting o Newborn:  - Very low risk. No evidence of increased risk of respiratory problems in multiple large studies - Physiological protection from breathing under water - Avoid underwater birth if there are any fetal complications - Once baby's head is out of the water, keep it out.  . Birth complication o Some reports of cord trauma, but risk decreased by bringing baby to surface gradually o No evidence of increased risk of shoulder dystocia. Mothers can usually change positions faster in water than in a bed, possibly aiding the maneuvers to free the shoulder.   You must attend a Waterbirth class at [facility]  3rd Wednesday of every month from 7-9pm  Free  Register by calling [phone] or online at [email or web address]  Bring us the certificate from the class to your prenatal appointment  Meet with a midwife at 36 weeks to see if you can still plan a waterbirth and to sign the consent.   Purchase or rent the following supplies:   Water Birth Pool (Birth Pool in a Box or [place] for instance)  (Tubs start ~$125)  Single-use disposable tub liner designed for your brand of tub  New garden hose labeled "lead-free", "suitable for drinking  water",  Electric drain pump to remove water (We recommend 792 gallon per hour or greater pump.)   Separate garden hose to remove the dirty water  Fish net  Bathing suit top (optional)  Long-handled mirror (optional)  Places to purchase or rent supplies  Yourwaterbirth.com for tub purchases and supplies  Waterbirthsolutions.com for tub purchases and supplies  [employer] (www.thelaborladies.com) $275 for tub rental/set-up & take down/kit   [place] [employer] (http://[email or web address].org/MeetUs.htm) Information regarding doulas (labor support) who provide pool rentals  Our practice has a Birth Pool in a Box tub at the hospital that you may borrow on a first-come-first-served basis. It is your responsibility to to set up, clean and break down the tub. We cannot guarantee the availability of this tub in advance. You are responsible for bringing all accessories listed above. If you do not have all necessary supplies you cannot have a waterbirth.    Things that would prevent you from having a waterbirth:  Premature, <37wks  Previous cesarean birth  Presence of thick meconium-stained fluid  Multiple gestation (Twins, triplets, etc.)  Uncontrolled diabetes or gestational diabetes requiring medication  Hypertension requiring medication or diagnosis of pre-eclampsia  Heavy vaginal bleeding  Non-reassuring fetal heart rate  Active infection (MRSA, etc.). Group B Strep is NOT a contraindication for  waterbirth.  If your labor has to be induced and induction method requires continuous  monitoring of the baby's heart rate  Other risks/issues identified by your obstetrical provider  Please remember that birth is unpredictable. Under certain unforeseeable   circumstances your provider may advise against giving birth in the tub. These decisions will be made on a case-by-case basis and with the safety of you and your baby as our highest priority.    

## 2017-12-19 ENCOUNTER — Encounter: Payer: Self-pay | Admitting: Obstetrics and Gynecology

## 2017-12-19 DIAGNOSIS — Z98891 History of uterine scar from previous surgery: Secondary | ICD-10-CM | POA: Insufficient documentation

## 2017-12-27 ENCOUNTER — Encounter: Payer: Self-pay | Admitting: *Deleted

## 2018-01-02 ENCOUNTER — Encounter: Payer: Self-pay | Admitting: *Deleted

## 2018-01-15 ENCOUNTER — Encounter: Payer: Medicaid Other | Admitting: Medical

## 2018-01-16 ENCOUNTER — Encounter: Payer: Medicaid Other | Admitting: Advanced Practice Midwife

## 2018-02-08 ENCOUNTER — Encounter: Payer: Medicaid Other | Admitting: Advanced Practice Midwife

## 2018-02-12 ENCOUNTER — Encounter (HOSPITAL_COMMUNITY): Payer: Self-pay

## 2018-02-12 ENCOUNTER — Other Ambulatory Visit: Payer: Self-pay

## 2018-02-12 ENCOUNTER — Inpatient Hospital Stay (HOSPITAL_COMMUNITY): Payer: Medicaid Other

## 2018-02-12 ENCOUNTER — Ambulatory Visit: Payer: Medicaid Other

## 2018-02-12 ENCOUNTER — Inpatient Hospital Stay (HOSPITAL_COMMUNITY)
Admission: AD | Admit: 2018-02-12 | Discharge: 2018-02-12 | Discharge status: Against Medical Advice | Payer: Medicaid Other | Source: Ambulatory Visit | Attending: Obstetrics & Gynecology | Admitting: Obstetrics & Gynecology

## 2018-02-12 DIAGNOSIS — O9989 Other specified diseases and conditions complicating pregnancy, childbirth and the puerperium: Secondary | ICD-10-CM

## 2018-02-12 DIAGNOSIS — R1033 Periumbilical pain: Secondary | ICD-10-CM

## 2018-02-12 DIAGNOSIS — K439 Ventral hernia without obstruction or gangrene: Secondary | ICD-10-CM | POA: Insufficient documentation

## 2018-02-12 DIAGNOSIS — R109 Unspecified abdominal pain: Secondary | ICD-10-CM | POA: Diagnosis present

## 2018-02-12 LAB — URINALYSIS, ROUTINE W REFLEX MICROSCOPIC
Bilirubin Urine: NEGATIVE
Glucose, UA: NEGATIVE mg/dL
Hgb urine dipstick: NEGATIVE
Ketones, ur: NEGATIVE mg/dL
Leukocytes, UA: NEGATIVE
Nitrite: NEGATIVE
Protein, ur: NEGATIVE mg/dL
SPECIFIC GRAVITY, URINE: 1.015 (ref 1.005–1.030)
pH: 6 (ref 5.0–8.0)

## 2018-02-12 NOTE — Discharge Instructions (Signed)
-  call [facility] at [place], [place], [place] [address] at  [phone] Hernia A hernia happens when an organ or tissue inside your body pushes out through a weak spot in the belly (abdomen). Follow these instructions at home:  Avoid stretching or overusing (straining) the muscles near the hernia.  Do not lift anything heavier than 10 lb (4.5 kg).  Use the muscles in your leg when you lift something up. Do not use the muscles in your back.  When you cough, try to cough gently.  Eat a diet that has a lot of fiber. Eat lots of fruits and vegetables.  Drink enough fluids to keep your pee (urine) clear or pale yellow. Try to drink 6-8 glasses of water a day.  Take medicines to make your poop soft (stool softeners) as told by your doctor.  Lose weight, if you are overweight.  Do not use any tobacco products, including cigarettes, chewing tobacco, or electronic cigarettes. If you need help quitting, ask your doctor.  Keep all follow-up visits as told by your doctor. This is important. Contact a doctor if:  The skin by the hernia gets puffy (swollen) or red.  The hernia is painful. Get help right away if:  You have a fever.  You have belly pain that is getting worse.  You feel sick to your stomach (nauseous) or you throw up (vomit).  You cannot push the hernia back in place by gently pressing on it while you are lying down.  The hernia: ? Changes in shape or size. ? Is stuck outside your belly. ? Changes color. ? Feels hard or tender. This information is not intended to replace advice given to you by your health care provider. Make sure you discuss any questions you have with your health care provider. Document Released: 02/01/2010 Document Revised: 01/20/2016 Document Reviewed: 06/24/2014 Elsevier Interactive Patient Education  [employer].

## 2018-02-12 NOTE — Progress Notes (Addendum)
G6P2 @ [redacted] wksga. Here for abd pain that started Sunday and progressively worse yesterday. Denies LOF or bleeding. +flutter  Last intercourse was Saturday.   Doppler 152  Providers at bs assessing.  1332: U/S ordered and flagged for test.   1558: pt unable to wait on provider but signed the [place] form since she could not wait any longer. Stated has an OB appt next day and would go there.

## 2018-02-12 NOTE — MAU Provider Note (Signed)
Patient Amanda [name] is a 32 y.o. [id number] At [redacted]w[redacted]d here with complaints of abdominal pain since Sunday, although she has had tenderness in the periumbilical region since Sunday. She denies discharge, LOF, bleeding, contractions, HA, NV or other ob-gyn.   History     CSN: [id number]  Arrival date and time: 02/12/18 1203   First Provider Initiated Contact with Patient 02/12/18 1313      Chief Complaint  Patient presents with  . Abdominal Pain   Abdominal Pain  This is a new problem. The current episode started in the past 7 days. The problem occurs intermittently. The pain is at a severity of 7/10. Pertinent negatives include no constipation, diarrhea, dysuria or nausea. The pain is aggravated by movement. The pain is relieved by being still.  She states that the area where she had her hernia repair in 2003 has been hurting her throughout the pregnancy but on Sunday it started to become much more tender and it hurts when she moves. The pain is right around her belly button; denies pain in her lower abdomen.   OB History    Gravida  6   Para  2   Term  2   Preterm      AB  3   Living  2     SAB  1   TAB  1   Ectopic  1   Multiple  0   Live Births  2           Past Medical History:  Diagnosis Date  . Anemia    1st pregn  . Gestational diabetes    Gestational DM, diet controlled - last pregn  . Hypoglycemia   . Sickle cell trait (HCC)     Past Surgical History:  Procedure Laterality Date  . CESAREAN SECTION  07/09/2004   FTP - chorio  . DILATION AND CURETTAGE OF UTERUS     2010  . HERNIA REPAIR     20 06   . UMBILICAL HERNIA REPAIR      Family History  Problem Relation Age of Onset  . Hyperlipidemia Mother   . Diabetes Father   . Hepatitis C Father   . Sickle cell anemia Father     Social History   Tobacco Use  . Smoking status: Former Smoker    Last attempt to quit: 11/18/2009    Years since quitting: 8.2  . Smokeless tobacco: Never Used   Substance Use Topics  . Alcohol use: No  . Drug use: No    Allergies: No Known Allergies  Medications Prior to Admission  Medication Sig Dispense Refill Last Dose  . Prenatal Vit-Fe Fumarate-FA (PRENATAL MULTIVITAMIN) TABS tablet Take 1 tablet by mouth at bedtime.   Taking    Review of Systems  Respiratory: Negative.   Cardiovascular: Negative.   Gastrointestinal: Positive for abdominal pain. Negative for constipation, diarrhea and nausea.  Genitourinary: Negative for dysuria.  Neurological: Negative.   Psychiatric/Behavioral: Negative.    Physical Exam   Last menstrual period 10/02/2017, unknown if currently breastfeeding.  Physical Exam  Constitutional: She appears well-developed.  HENT:  Head: Normocephalic.  Neck: Normal range of motion.  GI: Soft. She exhibits mass. There is tenderness.  Abdomen is soft and non-tender, hardened mass, possibly hernia mesh, at the level of the umbilicus. At the epigastric region there is a soft bulge, possibly new hernia.   Musculoskeletal: Normal range of motion.  Neurological: She is alert.  Skin: Skin is warm and dry.  Psychiatric: She has a normal mood and affect.    MAU Course  Procedures  MDM -US shows no evidence of new hernia, mesh in place. Discussed case with Dr. Harolyn Rutherford, who finds that patient is stable for discharge but that if she has more pain she should seek care of a General Surgeon at [facility].  -UA stable; cultures deferred due to lack of pelvic pain or  Assessment and Plan   1. Hernia of anterior abdominal wall    2. Patient stable for discharge. Reviewed warning precautions. Advised patient that most likely her omentum or intestines are moving into her diastis rectus and it is causing the pain.   3. Per Dr. Harolyn Rutherford, will discharge with Tramadol for discomfort.   Mervyn Skeeters Coyle Stordahl 02/12/2018, 1:34 PM

## 2018-02-12 NOTE — Progress Notes (Signed)
Patient walked into the office complaining of umbilical pain.  She is feeling the baby move well but is having a lot of discomfort.  Advised patient she should go to [facility] for evaluation.  Wheeled patient upstairs.

## 2018-02-13 ENCOUNTER — Ambulatory Visit (INDEPENDENT_AMBULATORY_CARE_PROVIDER_SITE_OTHER): Payer: Medicaid Other | Admitting: Medical

## 2018-02-13 VITALS — BP 102/66 | HR 94 | Wt 153.0 lb

## 2018-02-13 DIAGNOSIS — Z8632 Personal history of gestational diabetes: Secondary | ICD-10-CM

## 2018-02-13 DIAGNOSIS — O34219 Maternal care for unspecified type scar from previous cesarean delivery: Secondary | ICD-10-CM

## 2018-02-13 DIAGNOSIS — Z3482 Encounter for supervision of other normal pregnancy, second trimester: Secondary | ICD-10-CM

## 2018-02-13 DIAGNOSIS — Z3480 Encounter for supervision of other normal pregnancy, unspecified trimester: Secondary | ICD-10-CM

## 2018-02-13 DIAGNOSIS — A609 Anogenital herpesviral infection, unspecified: Secondary | ICD-10-CM

## 2018-02-13 DIAGNOSIS — F411 Generalized anxiety disorder: Secondary | ICD-10-CM

## 2018-02-13 NOTE — Progress Notes (Signed)
   PRENATAL VISIT NOTE  Subjective:  Amanda [name] is a 32 y.o. [id number] at [redacted]w[redacted]d being seen today for ongoing prenatal care.  She is currently monitored for the following issues for this high-risk pregnancy and has HSV (herpes simplex virus) anogenital infection; Hemoglobin A-S genotype ([place]); Anxiety state; Depression; History of gestational diabetes mellitus (GDM); Supervision of other normal pregnancy, antepartum; and History of cesarean delivery, currently pregnant on their problem list.  Patient reports no complaints. Seen in MAU yesterday for abdominal pain, likely related to hernia. Patient left AMA prior to completion of visit. Advised to consider Tramadol for pain, patient declines.  Contractions: Not present. Vag. Bleeding: None.  Movement: Present. Denies leaking of fluid.   The following portions of the patient's history were reviewed and updated as appropriate: allergies, current medications, past family history, past medical history, past social history, past surgical history and problem list. Problem list updated.  Objective:   Vitals:   02/13/18 1124  BP: 102/66  Pulse: 94  Weight: 153 lb (69.4 kg)    Fetal Status: Fetal Heart Rate (bpm): 142   Movement: Present     General:  Alert, oriented and cooperative. Patient is in no acute distress.  Skin: Skin is warm and dry. No rash noted.   Cardiovascular: Normal heart rate noted  Respiratory: Normal respiratory effort, no problems with respiration noted  Abdomen: Soft, gravid, appropriate for gestational age.  Pain/Pressure: Present     Pelvic: Cervical exam deferred        Extremities: Normal range of motion.  Edema: Trace  Mental Status: Normal mood and affect. Normal behavior. Normal judgment and thought content.   Assessment and Plan:  Pregnancy: [id number] at [redacted]w[redacted]d  1. Supervision of other normal pregnancy, antepartum - [place] MFM OB DETAIL +14 WK; scheduled   2. History of gestational diabetes mellitus (GDM) - Advised  to consider 81 mg ASA per previous provider recommendation. Patient unsure she will do so because she doesn't like to take medications when pregnant. Will review with her husband.   3. Anxiety state  4. HSV (herpes simplex virus) anogenital infection - Valtrex at 36 weeks   5. History of cesarean delivery, currently pregnant - has had VBAC since C/S - Would like to consider waterbirth, advised that this may be a contraindication to waterbirth and will need to discuss further with CNM at next visit   Preterm labor symptoms and general obstetric precautions including but not limited to vaginal bleeding, contractions, leaking of fluid and fetal movement were reviewed in detail with the patient. Please refer to After Visit Summary for other counseling recommendations.  Return in about 1 month (around 03/13/2018) for LOB.  Future Appointments  Date Time Provider [place]  02/20/2018  1:15 PM [place] 4 [id number] MFC-US    Kerry Hough, PA-C

## 2018-02-13 NOTE — Progress Notes (Signed)
Discussed weight gain. C/o abdominal pain sometimes - went to MAU yesterday- states told her likely her umbilical hernia.

## 2018-02-13 NOTE — Patient Instructions (Signed)
Childbirth Education Options: [facility] Department Classes:  Childbirth education classes can help you get ready for a positive parenting experience. You can also meet other expectant parents and get free stuff for your baby. Each class runs for five weeks on the same night and costs $45 for the mother-to-be and her support person. Medicaid covers the cost if you are eligible. Call [phone] to register. [facility] Childbirth Education:  [phone] or [phone] or [email or web address]  Baby & Me Class: Discuss newborn & infant parenting and family adjustment issues with other new mothers in a relaxed environment. Each week brings a new speaker or baby-centered activity. We encourage new mothers to join [place] every Thursday at 11:00am. Babies birth until crawling. No registration or fee. Daddy [employer]: This course offers Dads-to-be the tools and knowledge needed to feel confident on their journey to becoming new fathers. Experienced dads, who have been trained as coaches, teach dads-to-be how to hold, comfort, diaper, swaddle and play with their infant while being able to support the new mom as well. A class for men taught by men. $25/dad Big Brother/Big Sister: Let your children share in the joy of a new brother or sister in this special class designed just for them. Class includes discussion about how families care for babies: swaddling, holding, diapering, safety as well as how they can be helpful in their new role. This class is designed for children ages 45 to 48, but any age is welcome. Please register each child individually. $5/child  Mom Talk: This mom-led group offers support and connection to mothers as they journey through the adjustments and struggles of that sometimes overwhelming first year after the birth of a child. Tuesdays at 10:00am and Thursdays at 6:00pm. Babies welcome. No registration or fee. Breastfeeding Support Group: This group is a mother-to-mother  support circle where moms have the opportunity to share their breastfeeding experiences. A Lactation Consultant is present for questions and concerns. Meets each Tuesday at 11:00am. No fee or registration. Breastfeeding Your Baby: Learn what to expect in the first days of breastfeeding your newborn.  This class will help you feel more confident with the skills needed to begin your breastfeeding experience. Many new mothers are concerned about breastfeeding after leaving the hospital. This class will also address the most common fears and challenges about breastfeeding during the first few weeks, months and beyond. (call for fee) Comfort Techniques and Tour: This 2 hour interactive class will provide you the opportunity to learn & practice hands-on techniques that can help relieve some of the discomfort of labor and encourage your baby to rotate toward the best position for birth. You and your partner will be able to try a variety of labor positions with birth balls and rebozos as well as practice breathing, relaxation, and visualization techniques. A tour of the [facility] is included with this class. $20 per registrant and support person Childbirth Class- Weekend Option: This class is a Weekend version of our Birth & Baby series. It is designed for parents who have a difficult time fitting several weeks of classes into their schedule. It covers the care of your newborn and the basics of labor and childbirth. It also includes a [place] of [facility] and lunch. The class is held two consecutive days: beginning on Friday evening from 6:30 - 8:30 p.m. and the next day, Saturday from 9 a.m. - 4 p.m. (call for fee) Doren Custard Class: Interested in a waterbirth?  This  informational class will help you discover whether waterbirth is the right fit for you. Education about waterbirth itself, supplies you would need and how to assemble your support team is what you can  expect from this class. Some obstetrical practices require this class in order to pursue a waterbirth. (Not all obstetrical practices offer waterbirth-check with your healthcare provider.) Register only the expectant mom, but you are encouraged to bring your partner to class! Required if planning waterbirth, no fee. Infant/Child CPR: Parents, grandparents, babysitters, and friends learn Cardio-Pulmonary Resuscitation skills for infants and children. You will also learn how to treat both conscious and unconscious choking in infants and children. This Family & Friends program does not offer certification. Register each participant individually to ensure that enough mannequins are available. (Call for fee) Grandparent Love: Expecting a grandbaby? This class is for you! Learn about the latest infant care and safety recommendations and ways to support your own child as he or she transitions into the parenting role. Taught by Registered Nurses who are childbirth instructors, but most importantly...they are grandmothers too! $10/person. Childbirth Class- Natural Childbirth: This series of 5 weekly classes is for expectant parents who want to learn and practice natural methods of coping with the process of labor and childbirth. Relaxation, breathing, massage, visualization, role of the partner, and helpful positioning are highlighted. Participants learn how to be confident in their body's ability to give birth. This class will empower and help parents make informed decisions about their own care. Includes discussion that will help new parents transition into the immediate postpartum period. [facility] is included. We suggest taking this class between 25-32 weeks, but it's only a recommendation. $75 per registrant and one support person or $30 Medicaid. Childbirth Class- 3 week Series: This option of 3 weekly classes helps you and your labor partner prepare for childbirth. Newborn  care, labor & birth, cesarean birth, pain management, and comfort techniques are discussed and a [place] of [facility] is included. The class meets at the same time, on the same day of the week for 3 consecutive weeks beginning with the starting date you choose. $60 for registrant and one support person.  Marvelous Multiples: Expecting twins, triplets, or more? This class covers the differences in labor, birth, parenting, and breastfeeding issues that face multiples' parents. NICU tour is included. Led by a Certified Childbirth Educator who is the mother of twins. No fee. Caring for Baby: This class is for expectant and adoptive parents who want to learn and practice the most up-to-date newborn care for their babies. Focus is on birth through the first six weeks of life. Topics include feeding, bathing, diapering, crying, umbilical cord care, circumcision care and safe sleep. Parents learn to recognize symptoms of illness and when to call the pediatrician. Register only the mom-to-be and your partner or support person can plan to come with you! $10 per registrant and support person Childbirth Class- online option: This online class offers you the freedom to complete a Birth and Baby series in the comfort of your own home. The flexibility of this option allows you to review sections at your own pace, at times convenient to you and your support people. It includes additional video information, animations, quizzes, and extended activities. Get organized with helpful eClass tools, checklists, and trackers. Once you register online for the class, you will receive an email within a few days to accept the invitation and begin the class when the time  is right for you. The content will be available to you for 60 days. $60 for 60 days of online access for you and your support people.  Local Doulas: Natural Baby Doulas [email or web address] Tel:  [phone] [email or web address] [employer] [phone] [email or web address].[email or web address] The Labor Hassell Halim  (also do waterbirth tub rental) [phone] [email or web address] Triad Birth Doula [phone] [email or web address] NotebookDistributors.fi Sacred Rhythms  [phone] https://[email or web address]-[email or web address]/ [employer] (PADA) [email or web address] https://www.[email or web address]/ [place] Birth and Baby  http://[email or web address]/ Considering Waterbirth? Guide for patients at [facility] [employer]  Why consider waterbirth?  . Gentle birth for babies . Less pain medicine used in labor . May allow for passive descent/less pushing . May reduce perineal tears  . More mobility and instinctive maternal position changes . Increased maternal relaxation . Reduced blood pressure in labor  Is waterbirth safe? What are the risks of infection, drowning or other complications?  . Infection: o Very low risk (3.7 % for tub vs 4.8% for bed) o 7 in 8000 waterbirths with documented infection o Poorly cleaned equipment most common cause o Slightly lower group B strep transmission rate  . Drowning o Maternal:  - Very low risk   - Related to seizures or fainting o Newborn:  - Very low risk. No evidence of increased risk of respiratory problems in multiple large studies - Physiological protection from breathing under water - Avoid underwater birth if there are any fetal complications - Once baby's head is out of the water, keep it out.  . Birth complication o Some reports of cord trauma, but risk decreased by bringing baby to surface gradually o No evidence of increased risk of shoulder dystocia. Mothers can usually change positions faster in water than in a bed, possibly aiding the maneuvers to free the shoulder.   You must attend a Doren Custard class at [facility]  3rd Wednesday of every month from 7-9pm  [employer] by calling [phone] or online at [email or web address]  Bring [place] the certificate from the class to your prenatal appointment  Meet with a midwife at 36 weeks to see if you can still plan a waterbirth and to sign the consent.   Purchase or rent the following supplies:   Water Birth Pool (Birth Pool in a Box or [place] for instance)  (Tubs start ~$125)  Single-use disposable tub liner designed for your brand of tub  New garden hose labeled "lead-free", "suitable for drinking water",  Electric drain pump to remove water (We recommend 792 gallon per hour or greater pump.)   Separate garden hose to remove the dirty water  Fish net  Bathing suit top (optional)  Long-handled mirror (optional)  Places to purchase or rent supplies  GotWebTools.is for tub purchases and supplies  Waterbirthsolutions.com for tub purchases and supplies  The Labor Ladies (www.thelaborladies.com) $275 for tub rental/set-up & take down/kit   [employer] (http://www.fleming.com/.htm) Information regarding doulas (labor support) who provide pool rentals  Our practice has a Birth Pool in a Box tub at the hospital that you may borrow on a first-come-first-served basis. It is your responsibility to to set up, clean and break down the tub. We cannot guarantee the availability of this tub in advance. You are responsible for bringing all accessories listed above. If you do not have all necessary supplies you cannot have a waterbirth.    Things that would prevent you from having a waterbirth:  Premature, <37wks  Previous cesarean birth  Presence of thick meconium-stained fluid  Multiple gestation (Twins,  triplets, etc.)  Uncontrolled diabetes or gestational diabetes requiring medication  Hypertension requiring medication or diagnosis of pre-eclampsia  Heavy vaginal bleeding  Non-reassuring fetal  heart rate  Active infection (MRSA, etc.). Group B Strep is NOT a contraindication for  waterbirth.  If your labor has to be induced and induction method requires continuous  monitoring of the baby's heart rate  Other risks/issues identified by your obstetrical provider  Please remember that birth is unpredictable. Under certain unforeseeable circumstances your provider may advise against giving birth in the tub. These decisions will be made on a case-by-case basis and with the safety of you and your baby as our highest priority.

## 2018-02-20 ENCOUNTER — Ambulatory Visit (HOSPITAL_COMMUNITY)
Admission: RE | Admit: 2018-02-20 | Discharge: 2018-02-20 | Disposition: A | Discharge status: Home / Self Care | Payer: Medicaid Other | Source: Ambulatory Visit | Attending: Medical | Admitting: Medical

## 2018-02-20 ENCOUNTER — Encounter (HOSPITAL_COMMUNITY): Payer: Self-pay

## 2018-02-20 DIAGNOSIS — O34219 Maternal care for unspecified type scar from previous cesarean delivery: Secondary | ICD-10-CM | POA: Diagnosis not present

## 2018-02-20 DIAGNOSIS — Z3480 Encounter for supervision of other normal pregnancy, unspecified trimester: Secondary | ICD-10-CM

## 2018-02-20 DIAGNOSIS — O09292 Supervision of pregnancy with other poor reproductive or obstetric history, second trimester: Secondary | ICD-10-CM | POA: Insufficient documentation

## 2018-02-20 DIAGNOSIS — Z8632 Personal history of gestational diabetes: Secondary | ICD-10-CM | POA: Insufficient documentation

## 2018-02-20 DIAGNOSIS — Z3482 Encounter for supervision of other normal pregnancy, second trimester: Secondary | ICD-10-CM | POA: Diagnosis present

## 2018-02-20 DIAGNOSIS — Z3689 Encounter for other specified antenatal screening: Secondary | ICD-10-CM | POA: Insufficient documentation

## 2018-02-20 DIAGNOSIS — Z3A20 20 weeks gestation of pregnancy: Secondary | ICD-10-CM | POA: Insufficient documentation

## 2018-02-20 HISTORY — DX: Unspecified ectopic pregnancy without intrauterine pregnancy: O00.90

## 2018-03-15 ENCOUNTER — Ambulatory Visit (INDEPENDENT_AMBULATORY_CARE_PROVIDER_SITE_OTHER): Payer: Medicaid Other | Admitting: Advanced Practice Midwife

## 2018-03-15 VITALS — BP 99/67 | HR 109 | Wt 161.4 lb

## 2018-03-15 DIAGNOSIS — A609 Anogenital herpesviral infection, unspecified: Secondary | ICD-10-CM

## 2018-03-15 DIAGNOSIS — Z3482 Encounter for supervision of other normal pregnancy, second trimester: Secondary | ICD-10-CM

## 2018-03-15 DIAGNOSIS — Z8632 Personal history of gestational diabetes: Secondary | ICD-10-CM

## 2018-03-15 NOTE — Patient Instructions (Signed)

## 2018-03-15 NOTE — Progress Notes (Signed)
   PRENATAL VISIT NOTE  Subjective:  Amanda [name] is a 32 y.o. [id number] at [redacted]w[redacted]d being seen today for ongoing prenatal care.  She is currently monitored for the following issues for this low-risk pregnancy and has HSV (herpes simplex virus) anogenital infection; Hemoglobin A-S genotype ([place]); Anxiety state; Depression; History of gestational diabetes mellitus (GDM); Supervision of other normal pregnancy, antepartum; History of cesarean delivery, currently pregnant; and Encounter for fetal anatomic survey on their problem list.  Patient reports no complaints.  Contractions: Not present. Vag. Bleeding: None.  Movement: Present. Denies leaking of fluid.   The following portions of the patient's history were reviewed and updated as appropriate: allergies, current medications, past family history, past medical history, past social history, past surgical history and problem list. Problem list updated.  Objective:   Vitals:   03/15/18 0952  BP: 99/67  Pulse: (!) 109  Weight: 161 lb 6.4 oz (73.2 kg)    Fetal Status: Fetal Heart Rate (bpm): 135 Fundal Height: 24 cm Movement: Present     General:  Alert, oriented and cooperative. Patient is in no acute distress.  Skin: Skin is warm and dry. No rash noted.   Cardiovascular: Normal heart rate noted  Respiratory: Normal respiratory effort, no problems with respiration noted  Abdomen: Soft, gravid, appropriate for gestational age.  Pain/Pressure: Present     Pelvic: Cervical exam deferred        Extremities: Normal range of motion.  Edema: None  Mental Status: Normal mood and affect. Normal behavior. Normal judgment and thought content.   Assessment and Plan:  Pregnancy: [id number] at [redacted]w[redacted]d  1. Encounter for supervision of other normal pregnancy in second trimester --No complaints --Discussed ineligibility for water birth due to desire to [facility] --Discussed hydrotherapy via labor room shower as method for pain management --Encouraged to pursue  child birth education offered by hospital, info given  2. HSV (herpes simplex virus) anogenital infection --No current outbreak, plan to initiate suppression at 36 weeks  3. History of gestational diabetes --Taking Aspirin 81 mg PO daily  Preterm labor symptoms and general obstetric precautions including but not limited to vaginal bleeding, contractions, leaking of fluid and fetal movement were reviewed in detail with the patient. Please refer to After Visit Summary for other counseling recommendations.  Return in about 1 month (around 04/12/2018).  Future Appointments  Date Time Provider [place]  04/12/2018  8:50 AM [facility] LAB [facility]  04/12/2018  9:15 AM Lajean Manes, CNM [facility] [place], [place]  03/15/18  10:50 AM

## 2018-04-12 ENCOUNTER — Ambulatory Visit (INDEPENDENT_AMBULATORY_CARE_PROVIDER_SITE_OTHER): Payer: Medicaid Other | Admitting: Certified Nurse Midwife

## 2018-04-12 ENCOUNTER — Other Ambulatory Visit: Payer: Medicaid Other

## 2018-04-12 ENCOUNTER — Other Ambulatory Visit: Payer: Self-pay

## 2018-04-12 ENCOUNTER — Encounter: Payer: Self-pay | Admitting: Certified Nurse Midwife

## 2018-04-12 VITALS — BP 99/65 | HR 103 | Wt 163.4 lb

## 2018-04-12 DIAGNOSIS — Z3480 Encounter for supervision of other normal pregnancy, unspecified trimester: Secondary | ICD-10-CM

## 2018-04-12 DIAGNOSIS — Z3482 Encounter for supervision of other normal pregnancy, second trimester: Secondary | ICD-10-CM

## 2018-04-12 DIAGNOSIS — O34219 Maternal care for unspecified type scar from previous cesarean delivery: Secondary | ICD-10-CM

## 2018-04-12 DIAGNOSIS — A609 Anogenital herpesviral infection, unspecified: Secondary | ICD-10-CM

## 2018-04-12 NOTE — Progress Notes (Signed)
   PRENATAL VISIT NOTE  Subjective:  Amanda [name] is a 32 y.o. [id number] at [redacted]w[redacted]d being seen today for ongoing prenatal care.  She is currently monitored for the following issues for this low-risk pregnancy and has HSV (herpes simplex virus) anogenital infection; Hemoglobin A-S genotype ([place]); Anxiety state; Depression; History of gestational diabetes mellitus (GDM); Supervision of other normal pregnancy, antepartum; History of cesarean delivery, currently pregnant; and Encounter for fetal anatomic survey on their problem list.  Patient reports no complaints.  Contractions: Not present. Vag. Bleeding: None.  Movement: Present. Denies leaking of fluid.   The following portions of the patient's history were reviewed and updated as appropriate: allergies, current medications, past family history, past medical history, past social history, past surgical history and problem list. Problem list updated.  Objective:   Vitals:   04/12/18 0922  BP: 99/65  Pulse: (!) 103  Weight: 163 lb 6.4 oz (74.1 kg)    Fetal Status: Fetal Heart Rate (bpm): 154 Fundal Height: 29 cm Movement: Present     General:  Alert, oriented and cooperative. Patient is in no acute distress.  Skin: Skin is warm and dry. No rash noted.   Cardiovascular: Normal heart rate noted  Respiratory: Normal respiratory effort, no problems with respiration noted  Abdomen: Soft, gravid, appropriate for gestational age.  Pain/Pressure: Present     Pelvic: Cervical exam deferred        Extremities: Normal range of motion.  Edema: None  Mental Status: Normal mood and affect. Normal behavior. Normal judgment and thought content.   Assessment and Plan:  Pregnancy: [id number] at [redacted]w[redacted]d  1. Supervision of other normal pregnancy, antepartum -Patient doing well, no complaints  -Third trimester labs and GTT done today  - Educated patient on plan if GTT comes back elevated- patient verbalizes understanding - Educated and discussed options of  contraception after delivery, patient declines contraception after delivery states "she does not like taking medication and will do natural planning".    2. History of cesarean delivery, currently pregnant -Plans TOLAC, successful VBAC with last delivery in 2017  3. HSV (herpes simplex virus) anogenital infection  - Plans suppression to start at 36 weeks   Preterm labor symptoms and general obstetric precautions including but not limited to vaginal bleeding, contractions, leaking of fluid and fetal movement were reviewed in detail with the patient. Please refer to After Visit Summary for other counseling recommendations.  Return in about 18 days (around 04/30/2018) for [place].  Future Appointments  Date Time Provider [place]  04/30/2018  1:55 PM [place], [place]    Lajean Manes, CNM

## 2018-04-12 NOTE — Patient Instructions (Signed)

## 2018-04-12 NOTE — Progress Notes (Signed)
28 wk labs today

## 2018-04-13 LAB — GLUCOSE TOLERANCE, 2 HOURS W/ 1HR
GLUCOSE, 1 HOUR: 130 mg/dL (ref 65–179)
GLUCOSE, FASTING: 72 mg/dL (ref 65–91)
Glucose, 2 hour: 132 mg/dL (ref 65–152)

## 2018-04-13 LAB — CBC
HEMOGLOBIN: 10.9 g/dL — AB (ref 11.1–15.9)
Hematocrit: 31.2 % — ABNORMAL LOW (ref 34.0–46.6)
MCH: 29.5 pg (ref 26.6–33.0)
MCHC: 34.9 g/dL (ref 31.5–35.7)
MCV: 85 fL (ref 79–97)
Platelets: 159 10*3/uL (ref 150–450)
RBC: 3.69 x10E6/uL — ABNORMAL LOW (ref 3.77–5.28)
RDW: 14.3 % (ref 12.3–15.4)
WBC: 4.8 10*3/uL (ref 3.4–10.8)

## 2018-04-13 LAB — HIV ANTIBODY (ROUTINE TESTING W REFLEX): HIV SCREEN 4TH GENERATION: NONREACTIVE

## 2018-04-13 LAB — RPR: RPR: NONREACTIVE

## 2018-04-30 ENCOUNTER — Ambulatory Visit (INDEPENDENT_AMBULATORY_CARE_PROVIDER_SITE_OTHER): Payer: Medicaid Other | Admitting: Advanced Practice Midwife

## 2018-04-30 ENCOUNTER — Encounter: Payer: Self-pay | Admitting: Advanced Practice Midwife

## 2018-04-30 VITALS — BP 88/50 | HR 106 | Wt 167.1 lb

## 2018-04-30 DIAGNOSIS — Z23 Encounter for immunization: Secondary | ICD-10-CM | POA: Diagnosis not present

## 2018-04-30 DIAGNOSIS — Z3483 Encounter for supervision of other normal pregnancy, third trimester: Secondary | ICD-10-CM

## 2018-04-30 DIAGNOSIS — Z3480 Encounter for supervision of other normal pregnancy, unspecified trimester: Secondary | ICD-10-CM

## 2018-04-30 NOTE — Progress Notes (Signed)
   PRENATAL VISIT NOTE  Subjective:  Amanda [name] is a 32 y.o. [id number] at [redacted]w[redacted]d being seen today for ongoing prenatal care.  She is currently monitored for the following issues for this low-risk pregnancy and has HSV (herpes simplex virus) anogenital infection; Hemoglobin A-S genotype ([place]); Anxiety state; Depression; History of gestational diabetes mellitus (GDM); Supervision of other normal pregnancy, antepartum; and History of cesarean delivery, currently pregnant on their problem list.  Patient reports no complaints.  Contractions: Not present. Vag. Bleeding: None.  Movement: Present. Denies leaking of fluid.   The following portions of the patient's history were reviewed and updated as appropriate: allergies, current medications, past family history, past medical history, past social history, past surgical history and problem list. Problem list updated.  Objective:   Vitals:   04/30/18 1355  BP: (!) 88/50  Pulse: (!) 106  Weight: 167 lb 1.6 oz (75.8 kg)    Fetal Status: Fetal Heart Rate (bpm): 148 Fundal Height: 31 cm Movement: Present     General:  Alert, oriented and cooperative. Patient is in no acute distress.  Skin: Skin is warm and dry. No rash noted.   Cardiovascular: Normal heart rate noted  Respiratory: Normal respiratory effort, no problems with respiration noted  Abdomen: Soft, gravid, appropriate for gestational age.  Pain/Pressure: Present     Pelvic: Cervical exam deferred        Extremities: Normal range of motion.  Edema: None  Mental Status: Normal mood and affect. Normal behavior. Normal judgment and thought content.   Assessment and Plan:  Pregnancy: [id number] at [redacted]w[redacted]d  1. Supervision of other normal pregnancy, antepartum - Routine care  Preterm labor symptoms and general obstetric precautions including but not limited to vaginal bleeding, contractions, leaking of fluid and fetal movement were reviewed in detail with the patient. Please refer to After Visit  Summary for other counseling recommendations.  Return in about 2 weeks (around 05/14/2018).  No future appointments.  Marcille Buffy, CNM

## 2018-04-30 NOTE — Addendum Note (Signed)
Addended by: Alric Seton on: 04/30/2018 02:09 PM   Modules accepted: Orders

## 2018-05-09 ENCOUNTER — Telehealth: Payer: Self-pay | Admitting: General Practice

## 2018-05-09 DIAGNOSIS — Z3480 Encounter for supervision of other normal pregnancy, unspecified trimester: Secondary | ICD-10-CM

## 2018-05-09 DIAGNOSIS — B009 Herpesviral infection, unspecified: Secondary | ICD-10-CM

## 2018-05-09 DIAGNOSIS — A609 Anogenital herpesviral infection, unspecified: Secondary | ICD-10-CM

## 2018-05-09 NOTE — Telephone Encounter (Signed)
Patient called and left message on nurse voicemail line requesting a prescription for valtrex to be sent to her [facility] [address] [facility].

## 2018-05-10 MED ORDER — VALACYCLOVIR HCL 500 MG PO TABS: 500.0000 mg | ORAL_TABLET | Freq: Two times a day (BID) | ORAL | 2 refills | Status: DC

## 2018-05-10 MED ORDER — VALACYCLOVIR HCL 1 G PO TABS: 1000.0000 mg | ORAL_TABLET | Freq: Every day | ORAL | 0 refills | Status: DC

## 2018-05-10 NOTE — Telephone Encounter (Signed)
Amanda [name] came to window stating she called yesterday for refill and no one called her back. I brought her to a room and she reports she has a history of genital herpes in past, per chart is [redacted] weeks pregnant. She now c/o herpes lesions on her arm. Amanda Server, NP in to examine patient and orders given for Valtrex 1000mg  x 5 days, then 500mg  bid for remainder of pregnancy. RX sent. Patient voices understanding.

## 2018-05-15 ENCOUNTER — Ambulatory Visit (INDEPENDENT_AMBULATORY_CARE_PROVIDER_SITE_OTHER): Payer: Medicaid Other | Admitting: Family Medicine

## 2018-05-15 VITALS — BP 103/64 | HR 113 | Wt 168.1 lb

## 2018-05-15 DIAGNOSIS — A609 Anogenital herpesviral infection, unspecified: Secondary | ICD-10-CM

## 2018-05-15 DIAGNOSIS — O34219 Maternal care for unspecified type scar from previous cesarean delivery: Secondary | ICD-10-CM

## 2018-05-15 DIAGNOSIS — Z3480 Encounter for supervision of other normal pregnancy, unspecified trimester: Secondary | ICD-10-CM

## 2018-05-15 DIAGNOSIS — Z8632 Personal history of gestational diabetes: Secondary | ICD-10-CM

## 2018-05-15 DIAGNOSIS — F411 Generalized anxiety disorder: Secondary | ICD-10-CM

## 2018-05-15 NOTE — Progress Notes (Signed)
Pt states Monday had bleeding after intercourse, is that normal.

## 2018-05-15 NOTE — Progress Notes (Signed)
   PRENATAL VISIT NOTE Subjective:  Amanda [name] is a 32 y.o. [id number] at [redacted]w[redacted]d being seen today for ongoing prenatal care.  She is currently monitored for the following issues for this low-risk pregnancy and has HSV (herpes simplex virus) anogenital infection; Hemoglobin A-S genotype ([place]); Anxiety state; Depression; History of gestational diabetes mellitus (GDM); Supervision of other normal pregnancy, antepartum; and History of cesarean delivery, currently pregnant on their problem list.  - C/S - had infection, got to 9cm, had prolonged ROM, got chorio, needed abx after C/S, umbilical hernia, successful VBAC since then with toddler  - HSV flare - just finished course  - Bleeding after intercourse, just spotting lasted one day, has not noticed anymore, no abdominal pain  - Worried baby may come early, feels like carrying low   Patient reports no complaints.  Contractions: Irritability. Vag. Bleeding: Bloody Show.  Movement: Present. Denies leaking of fluid.   The following portions of the patient's history were reviewed and updated as appropriate: allergies, current medications, past family history, past medical history, past social history, past surgical history and problem list. Problem list updated.  Objective:   Vitals:   05/15/18 0946  BP: 103/64  Pulse: (!) 113  Weight: 168 lb 1.6 oz (76.2 kg)   Fetal Status: Fetal Heart Rate (bpm): 160 Fundal Height: 33 cm Movement: Present     General:  Alert, oriented and cooperative. Patient is in no acute distress.  Skin: Skin is warm and dry. No rash noted.   Cardiovascular: Normal heart rate noted  Respiratory: Normal respiratory effort, no problems with respiration noted  Abdomen: Soft, gravid, appropriate for gestational age.  Pain/Pressure: Present     Pelvic: Cervical exam deferred        Extremities: Normal range of motion.  Edema: None  Mental Status: Normal mood and affect. Normal behavior. Normal judgment and thought content.    Assessment and Plan:  Pregnancy: [id number] at [redacted]w[redacted]d  1. Supervision of other normal pregnancy, antepartum -- prenatal record reviewed and UTD, dicussed recent lab results  -- slightly above goal for weight gain, counseled   2. History of cesarean delivery, currently pregnant -- VBAC consent signed, high predicted success rate   Preterm labor symptoms and general obstetric precautions including but not limited to vaginal bleeding, contractions, leaking of fluid and fetal movement were reviewed in detail with the patient.Please refer to After Visit Summary for other counseling recommendations.   Return in about 2 weeks (around 05/29/2018) for return OB.  Future Appointments  Date Time Provider [place]  05/29/2018  9:15 AM Virginia Rochester, NP [facility] WOC  06/06/2018  9:15 AM Rasch, Artist Pais, NP [facility] WOC  06/13/2018  9:15 AM Rasch, Artist Pais, NP [facility] WOC  06/21/2018  9:15 AM Jorje Guild, NP [facility] W[facility]  06/28/2018  9:15 AM Jorje Guild, NP [place], DO

## 2018-05-15 NOTE — Patient Instructions (Signed)

## 2018-05-17 ENCOUNTER — Encounter: Payer: Self-pay | Admitting: *Deleted

## 2018-05-29 ENCOUNTER — Ambulatory Visit (INDEPENDENT_AMBULATORY_CARE_PROVIDER_SITE_OTHER): Payer: Medicaid Other | Admitting: Nurse Practitioner

## 2018-05-29 DIAGNOSIS — Z3480 Encounter for supervision of other normal pregnancy, unspecified trimester: Secondary | ICD-10-CM

## 2018-05-29 DIAGNOSIS — M5430 Sciatica, unspecified side: Secondary | ICD-10-CM | POA: Insufficient documentation

## 2018-05-29 NOTE — Progress Notes (Signed)
    Subjective:  Amanda [name] is a 32 y.o. [id number] at [redacted]w[redacted]d being seen today for ongoing prenatal care.  She is currently monitored for the following issues for this low-risk pregnancy and has HSV (herpes simplex virus) anogenital infection; Sickle Cell Trait  Hemoglobin A-S genotype ([place]); Anxiety state; Depression; History of gestational diabetes mellitus (GDM); Supervision of other normal pregnancy, antepartum; and History of cesarean delivery, currently pregnant on their problem list.  Patient reports pelvic pain and pain in back of left leg and hip.  Contractions: Irritability. Vag. Bleeding: None.  Movement: Present. Denies leaking of fluid.   The following portions of the patient's history were reviewed and updated as appropriate: allergies, current medications, past family history, past medical history, past social history, past surgical history and problem list. Problem list updated.  Objective:   Vitals:   05/29/18 1028  BP: 99/62  Pulse: (!) 109  Weight: 170 lb 4.8 oz (77.2 kg)    Fetal Status: Fetal Heart Rate (bpm): 153 Fundal Height: 36 cm Movement: Present  Presentation: Undeterminable  General:  Alert, oriented and cooperative. Patient is in no acute distress.  Skin: Skin is warm and dry. No rash noted.   Cardiovascular: Normal heart rate noted  Respiratory: Normal respiratory effort, no problems with respiration noted  Abdomen: Soft, gravid, appropriate for gestational age. Pain/Pressure: Present     Pelvic:  Cervical exam performed Dilation: Closed   Station: Ballotable  Extremities: Normal range of motion.  Edema: None  Mental Status: Normal mood and affect. Normal behavior. Normal judgment and thought content.   Urinalysis:      Assessment and Plan:  Pregnancy: [id number] at [redacted]w[redacted]d  1. Supervision of other normal pregnancy, antepartum Was feeling pain in groin bilaterally and tried doing exercises where she was moving legs out and together while lying on her back  - "windmills".  Advised her to stop these exercises and demonstrated several which she could do to stretch out her lower back.  2.  Sciatia - left side Will try ice pack.  Declines referral for physical therapy for now.  Had difficulty going from lying down to sitting up. Pain below left hip was aggravated with this movement and she had assistance from her partner and the provider to sit up.  Is not using the maternity support belt as she says it is too hot.  3.  Pubic symphysis discomfort Reviewed this pain is normal in late pregnancy.  Is not severe but some tenderness noted with palpation of pubic symphysis.  Preterm labor symptoms and general obstetric precautions including but not limited to vaginal bleeding, contractions, leaking of fluid and fetal movement were reviewed in detail with the patient. Please refer to After Visit Summary for other counseling recommendations.  Return in about 2 weeks (around 06/12/2018).   Let the office know if your leg pain is worsening.  Earlie Server, RN, MSN, NP-BC Nurse Practitioner, [facility] for [employer], [facility] 05/29/2018 10:57 AM

## 2018-06-06 ENCOUNTER — Ambulatory Visit (INDEPENDENT_AMBULATORY_CARE_PROVIDER_SITE_OTHER): Payer: Medicaid Other | Admitting: Obstetrics and Gynecology

## 2018-06-06 VITALS — BP 105/68 | HR 98 | Wt 171.0 lb

## 2018-06-06 DIAGNOSIS — Z8632 Personal history of gestational diabetes: Secondary | ICD-10-CM

## 2018-06-06 DIAGNOSIS — Z3483 Encounter for supervision of other normal pregnancy, third trimester: Secondary | ICD-10-CM

## 2018-06-06 DIAGNOSIS — O34219 Maternal care for unspecified type scar from previous cesarean delivery: Secondary | ICD-10-CM

## 2018-06-06 DIAGNOSIS — Z3480 Encounter for supervision of other normal pregnancy, unspecified trimester: Secondary | ICD-10-CM

## 2018-06-06 DIAGNOSIS — D573 Sickle-cell trait: Secondary | ICD-10-CM

## 2018-06-06 DIAGNOSIS — A609 Anogenital herpesviral infection, unspecified: Secondary | ICD-10-CM

## 2018-06-06 NOTE — Progress Notes (Signed)
   PRENATAL VISIT NOTE  Subjective:  Amanda [name] is a 32 y.o. [id number] at [redacted]w[redacted]d being seen today for ongoing prenatal care.  She is currently monitored for the following issues for this low-risk pregnancy and has HSV (herpes simplex virus) anogenital infection; Sickle Cell Trait  Hemoglobin A-S genotype ([place]); Anxiety state; Depression; History of gestational diabetes mellitus (GDM); Supervision of other normal pregnancy, antepartum; History of cesarean delivery, currently pregnant; and Sciatic leg pain on their problem list.  Patient reports no complaints.  Contractions: Irritability. Vag. Bleeding: None.  Movement: Present. Denies leaking of fluid.   The following portions of the patient's history were reviewed and updated as appropriate: allergies, current medications, past family history, past medical history, past social history, past surgical history and problem list. Problem list updated.  Objective:   Vitals:   06/06/18 0943  BP: 105/68  Pulse: 98  Weight: 171 lb (77.6 kg)    Fetal Status: Fetal Heart Rate (bpm): 154   Movement: Present     General:  Alert, oriented and cooperative. Patient is in no acute distress.  Skin: Skin is warm and dry. No rash noted.   Cardiovascular: Normal heart rate noted  Respiratory: Normal respiratory effort, no problems with respiration noted  Abdomen: Soft, gravid, appropriate for gestational age.  Pain/Pressure: Present     Pelvic: Cervical exam deferred        Extremities: Normal range of motion.  Edema: None  Mental Status: Normal mood and affect. Normal behavior. Normal judgment and thought content.   Assessment and Plan:  Pregnancy: [id number] at [redacted]w[redacted]d  1. Supervision of other normal pregnancy, antepartum  2. HSV (herpes simplex virus) anogenital infection  - on Suppression   3. Sickle Cell Trait  Hemoglobin A-S genotype (HCC)  - Urine culture collected today.  4. History of gestational diabetes mellitus (GDM)  - Taking ASA  daily, continue this.   5. History of cesarean delivery, currently pregnant  TOLAC consent signed 9/18   Preterm labor symptoms and general obstetric precautions including but not limited to vaginal bleeding, contractions, leaking of fluid and fetal movement were reviewed in detail with the patient. Please refer to After Visit Summary for other counseling recommendations.  No follow-ups on file.  Future Appointments  Date Time Provider [place]  06/13/2018  9:15 AM Andelyn Spade, Artist Pais, NP [facility]  06/21/2018  9:15 AM Jorje Guild, NP [facility]  06/28/2018  9:15 AM Jorje Guild, NP [place], NP

## 2018-06-08 LAB — URINE CULTURE, OB REFLEX

## 2018-06-08 LAB — CULTURE, OB URINE

## 2018-06-10 ENCOUNTER — Other Ambulatory Visit: Payer: Self-pay | Admitting: Obstetrics and Gynecology

## 2018-06-10 ENCOUNTER — Encounter: Payer: Self-pay | Admitting: Obstetrics and Gynecology

## 2018-06-10 DIAGNOSIS — O2343 Unspecified infection of urinary tract in pregnancy, third trimester: Secondary | ICD-10-CM | POA: Insufficient documentation

## 2018-06-10 MED ORDER — CEPHALEXIN 500 MG PO CAPS: 500.0000 mg | ORAL_CAPSULE | Freq: Four times a day (QID) | ORAL | 0 refills | Status: DC

## 2018-06-10 NOTE — Progress Notes (Signed)
+   Urine culture Message sent to patient via Mychart Rx: Keflex sent to pharmacy  Allergies confirmed on chart   Trenese Haft, Artist Pais, NP 06/10/2018 8:57 AM

## 2018-06-13 ENCOUNTER — Other Ambulatory Visit (HOSPITAL_COMMUNITY)
Admission: RE | Admit: 2018-06-13 | Discharge: 2018-06-13 | Disposition: A | Discharge status: Home / Self Care | Payer: Medicaid Other | Source: Ambulatory Visit | Attending: Obstetrics and Gynecology | Admitting: Obstetrics and Gynecology

## 2018-06-13 ENCOUNTER — Ambulatory Visit (INDEPENDENT_AMBULATORY_CARE_PROVIDER_SITE_OTHER): Payer: Medicaid Other | Admitting: Obstetrics and Gynecology

## 2018-06-13 VITALS — BP 99/61 | HR 114 | Wt 171.4 lb

## 2018-06-13 DIAGNOSIS — Z8632 Personal history of gestational diabetes: Secondary | ICD-10-CM

## 2018-06-13 DIAGNOSIS — Z3483 Encounter for supervision of other normal pregnancy, third trimester: Secondary | ICD-10-CM | POA: Insufficient documentation

## 2018-06-13 DIAGNOSIS — A609 Anogenital herpesviral infection, unspecified: Secondary | ICD-10-CM

## 2018-06-13 DIAGNOSIS — Z3480 Encounter for supervision of other normal pregnancy, unspecified trimester: Secondary | ICD-10-CM

## 2018-06-13 DIAGNOSIS — O2343 Unspecified infection of urinary tract in pregnancy, third trimester: Secondary | ICD-10-CM

## 2018-06-13 LAB — OB RESULTS CONSOLE GBS: STREP GROUP B AG: NEGATIVE

## 2018-06-13 LAB — OB RESULTS CONSOLE GC/CHLAMYDIA: GC PROBE AMP, GENITAL: NEGATIVE

## 2018-06-13 NOTE — Progress Notes (Signed)
   PRENATAL VISIT NOTE  Subjective:  Amanda [name] is a 32 y.o. [id number] at [redacted]w[redacted]d being seen today for ongoing prenatal care.  She is currently monitored for the following issues for this low-risk pregnancy and has HSV (herpes simplex virus) anogenital infection; Sickle Cell Trait  Hemoglobin A-S genotype ([place]); Anxiety state; Depression; History of gestational diabetes mellitus (GDM); Supervision of other normal pregnancy, antepartum; History of cesarean delivery, currently pregnant; Sciatic leg pain; and UTI in pregnancy, antepartum, third trimester on their problem list.  Patient reports no complaints.  Contractions: Irritability. Vag. Bleeding: None.  Movement: Present. Denies leaking of fluid.   The following portions of the patient's history were reviewed and updated as appropriate: allergies, current medications, past family history, past medical history, past social history, past surgical history and problem list. Problem list updated.  Objective:   Vitals:   06/13/18 0949  BP: 99/61  Pulse: (!) 114  Weight: 171 lb 6.4 oz (77.7 kg)    Fetal Status: Fetal Heart Rate (bpm): 150 Fundal Height: 37 cm Movement: Present     General:  Alert, oriented and cooperative. Patient is in no acute distress.  Skin: Skin is warm and dry. No rash noted.   Cardiovascular: Normal heart rate noted  Respiratory: Normal respiratory effort, no problems with respiration noted  Abdomen: Soft, gravid, appropriate for gestational age.  Pain/Pressure: Present     Pelvic: Cervical exam performed        Extremities: Normal range of motion.  Edema: None  Mental Status: Normal mood and affect. Normal behavior. Normal judgment and thought content.   Assessment and Plan:  Pregnancy: K2H0623 at [redacted]w[redacted]d  1. Supervision of other normal pregnancy, antepartum - Culture, beta strep (group b only) - GC/Chlamydia probe amp ([place])not at [facility]  2. UTI in pregnancy, antepartum, third trimester  - My chart  message sent on 10/14 however patient never checked it. She plans to pick up Keflex today.   3. HSV (herpes simplex virus) anogenital infection  - continue Valtrex for suppression.   4. History of gestational diabetes mellitus (GDM)  - Continue ASA until 6 weeks PP    Preterm labor symptoms and general obstetric precautions including but not limited to vaginal bleeding, contractions, leaking of fluid and fetal movement were reviewed in detail with the patient. Please refer to After Visit Summary for other counseling recommendations.  No follow-ups on file.  Future Appointments  Date Time Provider [place]  06/21/2018  9:15 AM Jorje Guild, NP [facility] [place]  06/28/2018  9:15 AM Jorje Guild, NP [place], NP

## 2018-06-14 LAB — GC/CHLAMYDIA PROBE AMP (~~LOC~~) NOT AT ARMC
Chlamydia: NEGATIVE
NEISSERIA GONORRHEA: NEGATIVE

## 2018-06-17 LAB — CULTURE, BETA STREP (GROUP B ONLY): Strep Gp B Culture: NEGATIVE

## 2018-06-21 ENCOUNTER — Ambulatory Visit (INDEPENDENT_AMBULATORY_CARE_PROVIDER_SITE_OTHER): Payer: Medicaid Other | Admitting: Student

## 2018-06-21 VITALS — BP 107/56 | HR 97 | Wt 175.5 lb

## 2018-06-21 DIAGNOSIS — Z3480 Encounter for supervision of other normal pregnancy, unspecified trimester: Secondary | ICD-10-CM

## 2018-06-21 NOTE — Progress Notes (Signed)
   PRENATAL VISIT NOTE  Subjective:  Amanda [name] is a 32 y.o. [id number] at [redacted]w[redacted]d being seen today for ongoing prenatal care.  She is currently monitored for the following issues for this low-risk pregnancy and has HSV (herpes simplex virus) anogenital infection; Sickle Cell Trait  Hemoglobin A-S genotype ([place]); Anxiety state; Depression; History of gestational diabetes mellitus (GDM); Supervision of other normal pregnancy, antepartum; History of cesarean delivery, currently pregnant; Sciatic leg pain; and UTI in pregnancy, antepartum, third trimester on their problem list.  Patient reports no complaints.  Contractions: Irritability. Vag. Bleeding: None.  Movement: Present. Denies leaking of fluid.   The following portions of the patient's history were reviewed and updated as appropriate: allergies, current medications, past family history, past medical history, past social history, past surgical history and problem list. Problem list updated.  Objective:   Vitals:   06/21/18 0916  BP: (!) 107/56  Pulse: 97  Weight: 175 lb 8 oz (79.6 kg)    Fetal Status: Fetal Heart Rate (bpm): 131 Fundal Height: 37 cm Movement: Present  Presentation: Vertex  General:  Alert, oriented and cooperative. Patient is in no acute distress.  Skin: Skin is warm and dry. No rash noted.   Cardiovascular: Normal heart rate noted  Respiratory: Normal respiratory effort, no problems with respiration noted  Abdomen: Soft, gravid, appropriate for gestational age.  Pain/Pressure: Present     Pelvic: Cervical exam deferred        Extremities: Normal range of motion.  Edema: None  Mental Status: Normal mood and affect. Normal behavior. Normal judgment and thought content.   Assessment and Plan:  Pregnancy: [id number] at [redacted]w[redacted]d  1. Supervision of other normal pregnancy, antepartum -doing well other than ongoing issues with sciatica  Term labor symptoms and general obstetric precautions including but not limited to  vaginal bleeding, contractions, leaking of fluid and fetal movement were reviewed in detail with the patient. Please refer to After Visit Summary for other counseling recommendations.  Return in about 1 week (around 06/28/2018) for Routine OB.  Future Appointments  Date Time Provider [place]  06/28/2018  9:15 AM Jorje Guild, NP [facility]    Jorje Guild, NP

## 2018-06-21 NOTE — Patient Instructions (Signed)
Before [facility] Before your baby arrives it is important to:  Have all of the supplies that you will need to care for your baby.  Know where to go if there is an emergency.  Discuss the baby's arrival with other family members.  What supplies will I need?  It is recommended that you have the following supplies: Large Items  Crib.  Crib mattress.  Rear-facing infant car seat. If possible, have a trained professional check to make sure that it is installed correctly.  Feeding  6-8 bottles that are 4-5 oz in size.  6-8 nipples.  Bottle brush.  Sterilizer, or a large pan or kettle with a lid.  A way to boil and cool water.  If you will be breastfeeding: ? Breast pump. ? Nipple cream. ? Nursing bra. ? Breast pads. ? Breast shields.  If you will be formula feeding: ? Formula. ? Measuring cups. ? Measuring spoons.  Bathing  Mild baby soap and baby shampoo.  Petroleum jelly.  Soft cloth towel and washcloth.  Hooded towel.  Cotton balls.  Bath basin.  Other Supplies  Rectal thermometer.  Bulb syringe.  Baby wipes or washcloths for diaper changes.  Diaper bag.  Changing pad.  Clothing, including one-piece outfits and pajamas.  Baby nail clippers.  Receiving blankets.  Mattress pad and sheets for the crib.  Night-light for the baby's room.  Baby monitor.  2 or 3 pacifiers.  Either 24-36 cloth diapers and waterproof diaper covers or a box of disposable diapers. You may need to use as many as 10-12 diapers per day.  How do I prepare for an emergency? Prepare for an emergency by:  Knowing how to get to the nearest hospital.  Listing the phone numbers of your baby's health care providers near your home phone and in your cell phone.  How do I prepare my family?  Decide how to handle visitors.  If you have other children: ? Talk with them about the baby coming home. Ask them how they feel about it. ? Read a book together about  being a new big brother or sister. ? Find ways to let them help you prepare for the new baby. ? Have someone ready to care for them while you are in the hospital. This information is not intended to replace advice given to you by your health care provider. Make sure you discuss any questions you have with your health care provider. Document Released: 07/27/2008 Document Revised: 01/20/2016 Document Reviewed: 07/22/2014 Elsevier Interactive Patient Education  [employer].

## 2018-06-28 ENCOUNTER — Ambulatory Visit (INDEPENDENT_AMBULATORY_CARE_PROVIDER_SITE_OTHER): Payer: Medicaid Other | Admitting: Student

## 2018-06-28 VITALS — BP 91/61 | HR 110 | Wt 175.1 lb

## 2018-06-28 DIAGNOSIS — Z3480 Encounter for supervision of other normal pregnancy, unspecified trimester: Secondary | ICD-10-CM

## 2018-06-28 NOTE — Patient Instructions (Signed)
Back Pain in Pregnancy Back pain during pregnancy is common. Back pain may be caused by several factors that are related to changes during your pregnancy. Follow these instructions at home: Managing pain, stiffness, and swelling  If directed, apply ice for sudden (acute) back pain. ? Put ice in a plastic bag. ? Place a towel between your skin and the bag. ? Leave the ice on for 20 minutes, 2-3 times per day.  If directed, apply heat to the affected area before you exercise: ? Place a towel between your skin and the heat pack or heating pad. ? Leave the heat on for 20-30 minutes. ? Remove the heat if your skin turns bright red. This is especially important if you are unable to feel pain, heat, or cold. You may have a greater risk of getting burned. Activity  Exercise as told by your health care provider. Exercising is the best way to prevent or manage back pain.  Listen to your body when lifting. If lifting hurts, ask for help or bend your knees. This uses your leg muscles instead of your back muscles.  Squat down when picking up something from the floor. Do not bend over.  Only use bed rest as told by your health care provider. Bed rest should only be used for the most severe episodes of back pain. Standing, Sitting, and Lying Down  Do not stand in one place for long periods of time.  Use good posture when sitting. Make sure your head rests over your shoulders and is not hanging forward. Use a pillow on your lower back if necessary.  Try sleeping on your side, preferably the left side, with a pillow or two between your legs. If you are sore after a night's rest, your bed may be too soft. A firm mattress may provide more support for your back during pregnancy. General instructions  Do not wear high heels.  Eat a healthy diet. Try to gain weight within your health care provider's recommendations.  Use a maternity girdle, elastic sling, or back brace as told by your health care  provider.  Take over-the-counter and prescription medicines only as told by your health care provider.  Keep all follow-up visits as told by your health care provider. This is important. This includes any visits with any specialists, such as a physical therapist. Contact a health care provider if:  Your back pain interferes with your daily activities.  You have increasing pain in other parts of your body. Get help right away if:  You develop numbness, tingling, weakness, or problems with the use of your arms or legs.  You develop severe back pain that is not controlled with medicine.  You have a sudden change in bowel or bladder control.  You develop shortness of breath, dizziness, or you faint.  You develop nausea, vomiting, or sweating.  You have back pain that is a rhythmic, cramping pain similar to labor pains. Labor pain is usually 1-2 minutes apart, lasts for about 1 minute, and involves a bearing down feeling or pressure in your pelvis.  You have back pain and your water breaks or you have vaginal bleeding.  You have back pain or numbness that travels down your leg.  Your back pain developed after you fell.  You develop pain on one side of your back.  You see blood in your urine.  You develop skin blisters in the area of your back pain. This information is not intended to replace advice given to you   by your health care provider. Make sure you discuss any questions you have with your health care provider. Document Released: 11/22/2005 Document Revised: 01/20/2016 Document Reviewed: 04/28/2015 Elsevier Interactive Patient Education  2018 Elsevier Inc.  

## 2018-06-28 NOTE — Progress Notes (Signed)
   PRENATAL VISIT NOTE  Subjective:  Amanda [name] is a 32 y.o. [id number] at [redacted]w[redacted]d being seen today for ongoing prenatal care.  She is currently monitored for the following issues for this low-risk pregnancy and has HSV (herpes simplex virus) anogenital infection; Sickle Cell Trait  Hemoglobin A-S genotype ([place]); Anxiety state; Depression; History of gestational diabetes mellitus (GDM); Supervision of other normal pregnancy, antepartum; History of cesarean delivery, currently pregnant; Sciatic leg pain; and UTI in pregnancy, antepartum, third trimester on their problem list.  Patient reports pelvic pressure & continued sciatica. .  Contractions: Irregular. Vag. Bleeding: None.  Movement: Present. Denies leaking of fluid.   The following portions of the patient's history were reviewed and updated as appropriate: allergies, current medications, past family history, past medical history, past social history, past surgical history and problem list. Problem list updated.  Objective:   Vitals:   06/28/18 0946  BP: 91/61  Pulse: (!) 110  Weight: 175 lb 1.6 oz (79.4 kg)    Fetal Status: Fetal Heart Rate (bpm): 146 Fundal Height: 39 cm Movement: Present  Presentation: Vertex  General:  Alert, oriented and cooperative. Patient is in no acute distress.  Skin: Skin is warm and dry. No rash noted.   Cardiovascular: Normal heart rate noted  Respiratory: Normal respiratory effort, no problems with respiration noted  Abdomen: Soft, gravid, appropriate for gestational age.  Pain/Pressure: Present     Pelvic: Cervical exam performed Dilation: 1.5 Effacement (%): 50 Station: -3  Extremities: Normal range of motion.  Edema: None  Mental Status: Normal mood and affect. Normal behavior. Normal judgment and thought content.   Assessment and Plan:  Pregnancy: [id number] at [redacted]w[redacted]d  1. Supervision of other normal pregnancy, antepartum   Term labor symptoms and general obstetric precautions including but not  limited to vaginal bleeding, contractions, leaking of fluid and fetal movement were reviewed in detail with the patient. Please refer to After Visit Summary for other counseling recommendations.  Return in about 1 week (around 07/05/2018) for Routine OB.  Future Appointments  Date Time Provider [place]  07/05/2018 10:55 AM Jorje Guild, NP [facility]    Jorje Guild, NP

## 2018-07-01 ENCOUNTER — Inpatient Hospital Stay (HOSPITAL_COMMUNITY)
Admission: AD | Admit: 2018-07-01 | Discharge: 2018-07-02 | Disposition: A | Discharge status: Home / Self Care | Payer: Medicaid Other | Source: Ambulatory Visit | Attending: Obstetrics & Gynecology | Admitting: Obstetrics & Gynecology

## 2018-07-01 DIAGNOSIS — O479 False labor, unspecified: Secondary | ICD-10-CM

## 2018-07-01 DIAGNOSIS — Z3A39 39 weeks gestation of pregnancy: Secondary | ICD-10-CM

## 2018-07-01 DIAGNOSIS — O471 False labor at or after 37 completed weeks of gestation: Secondary | ICD-10-CM

## 2018-07-01 DIAGNOSIS — O2343 Unspecified infection of urinary tract in pregnancy, third trimester: Secondary | ICD-10-CM

## 2018-07-01 DIAGNOSIS — O34219 Maternal care for unspecified type scar from previous cesarean delivery: Secondary | ICD-10-CM

## 2018-07-01 NOTE — MAU Note (Signed)
CTX every 4-5 minutes.  Was 1.5 cm on Friday.  No LOF/VB.  + FM.

## 2018-07-02 ENCOUNTER — Encounter (HOSPITAL_COMMUNITY): Payer: Self-pay | Admitting: *Deleted

## 2018-07-02 ENCOUNTER — Encounter (HOSPITAL_COMMUNITY): Payer: Self-pay

## 2018-07-02 ENCOUNTER — Inpatient Hospital Stay (HOSPITAL_COMMUNITY)
Admission: AD | Admit: 2018-07-02 | Discharge: 2018-07-04 | DRG: 806 | Disposition: A | Discharge status: Home / Self Care | Payer: Medicaid Other | Attending: Family Medicine | Admitting: Family Medicine

## 2018-07-02 ENCOUNTER — Inpatient Hospital Stay (HOSPITAL_BASED_OUTPATIENT_CLINIC_OR_DEPARTMENT_OTHER): Payer: Medicaid Other

## 2018-07-02 DIAGNOSIS — O9832 Other infections with a predominantly sexual mode of transmission complicating childbirth: Secondary | ICD-10-CM | POA: Diagnosis present

## 2018-07-02 DIAGNOSIS — Z3A39 39 weeks gestation of pregnancy: Secondary | ICD-10-CM | POA: Diagnosis not present

## 2018-07-02 DIAGNOSIS — O289 Unspecified abnormal findings on antenatal screening of mother: Secondary | ICD-10-CM

## 2018-07-02 DIAGNOSIS — D573 Sickle-cell trait: Secondary | ICD-10-CM | POA: Diagnosis present

## 2018-07-02 DIAGNOSIS — O34219 Maternal care for unspecified type scar from previous cesarean delivery: Secondary | ICD-10-CM | POA: Diagnosis present

## 2018-07-02 DIAGNOSIS — O9902 Anemia complicating childbirth: Secondary | ICD-10-CM | POA: Diagnosis present

## 2018-07-02 DIAGNOSIS — Z87891 Personal history of nicotine dependence: Secondary | ICD-10-CM

## 2018-07-02 DIAGNOSIS — O2343 Unspecified infection of urinary tract in pregnancy, third trimester: Secondary | ICD-10-CM

## 2018-07-02 DIAGNOSIS — A6000 Herpesviral infection of urogenital system, unspecified: Secondary | ICD-10-CM | POA: Diagnosis present

## 2018-07-02 DIAGNOSIS — Z3483 Encounter for supervision of other normal pregnancy, third trimester: Secondary | ICD-10-CM | POA: Diagnosis present

## 2018-07-02 MED ORDER — OXYCODONE-ACETAMINOPHEN 5-325 MG PO TABS: 2.0000 | ORAL_TABLET | ORAL | Status: DC | PRN

## 2018-07-02 MED ORDER — WITCH HAZEL-GLYCERIN EX PADS: 1.0000 "application " | MEDICATED_PAD | CUTANEOUS | Status: DC | PRN

## 2018-07-02 MED ORDER — COCONUT OIL OIL
1.0000 "application " | TOPICAL_OIL | Status: DC | PRN
  Filled 2018-07-02: qty 120

## 2018-07-02 MED ORDER — SENNOSIDES-DOCUSATE SODIUM 8.6-50 MG PO TABS
2.0000 | ORAL_TABLET | ORAL | Status: DC
  Administered 2018-07-02 – 2018-07-03 (×2): 2 via ORAL
  Filled 2018-07-02 (×2): qty 2

## 2018-07-02 MED ORDER — BENZOCAINE-MENTHOL 20-0.5 % EX AERO
1.0000 "application " | INHALATION_SPRAY | CUTANEOUS | Status: DC | PRN
  Administered 2018-07-02: 1 via TOPICAL
  Filled 2018-07-02 (×2): qty 56

## 2018-07-02 MED ORDER — PRENATAL MULTIVITAMIN CH
1.0000 | ORAL_TABLET | Freq: Every day | ORAL | Status: DC
  Administered 2018-07-03: 1 via ORAL
  Filled 2018-07-02: qty 1

## 2018-07-02 MED ORDER — IBUPROFEN 600 MG PO TABS
600.0000 mg | ORAL_TABLET | Freq: Once | ORAL | Status: AC
  Administered 2018-07-02: 600 mg via ORAL
  Filled 2018-07-02: qty 1

## 2018-07-02 MED ORDER — LIDOCAINE HCL (PF) 1 % IJ SOLN
INTRAMUSCULAR | Status: AC
  Administered 2018-07-02: 30 mL
  Filled 2018-07-02: qty 30

## 2018-07-02 MED ORDER — ONDANSETRON HCL 4 MG PO TABS: 4.0000 mg | ORAL_TABLET | ORAL | Status: DC | PRN

## 2018-07-02 MED ORDER — DIBUCAINE 1 % RE OINT
1.0000 "application " | TOPICAL_OINTMENT | RECTAL | Status: DC | PRN
  Filled 2018-07-02: qty 28

## 2018-07-02 MED ORDER — OXYCODONE-ACETAMINOPHEN 5-325 MG PO TABS: 1.0000 | ORAL_TABLET | ORAL | Status: DC | PRN

## 2018-07-02 MED ORDER — SIMETHICONE 80 MG PO CHEW: 80.0000 mg | CHEWABLE_TABLET | ORAL | Status: DC | PRN

## 2018-07-02 MED ORDER — IBUPROFEN 600 MG PO TABS
600.0000 mg | ORAL_TABLET | Freq: Four times a day (QID) | ORAL | Status: DC
  Administered 2018-07-02 – 2018-07-04 (×8): 600 mg via ORAL
  Filled 2018-07-02 (×8): qty 1

## 2018-07-02 MED ORDER — ACETAMINOPHEN 325 MG PO TABS
650.0000 mg | ORAL_TABLET | ORAL | Status: DC | PRN
  Administered 2018-07-02 – 2018-07-03 (×2): 650 mg via ORAL
  Filled 2018-07-02 (×2): qty 2

## 2018-07-02 MED ORDER — TETANUS-DIPHTH-ACELL PERTUSSIS 5-2.5-18.5 LF-MCG/0.5 IM SUSP: 0.5000 mL | Freq: Once | INTRAMUSCULAR | Status: DC

## 2018-07-02 MED ORDER — DIPHENHYDRAMINE HCL 25 MG PO CAPS: 25.0000 mg | ORAL_CAPSULE | Freq: Four times a day (QID) | ORAL | Status: DC | PRN

## 2018-07-02 MED ORDER — ONDANSETRON HCL 4 MG/2ML IJ SOLN: 4.0000 mg | INTRAMUSCULAR | Status: DC | PRN

## 2018-07-02 MED ORDER — ZOLPIDEM TARTRATE 5 MG PO TABS: 5.0000 mg | ORAL_TABLET | Freq: Every evening | ORAL | Status: DC | PRN

## 2018-07-02 MED ORDER — OXYTOCIN 10 UNIT/ML IJ SOLN
INTRAMUSCULAR | Status: AC
  Filled 2018-07-02: qty 1

## 2018-07-02 NOTE — H&P (Signed)
Correne Slattery is a 32 y.o. female presenting for labor. OB History    Gravida  6   Para  2   Term  2   Preterm      AB  3   Living  2     SAB  1   TAB  1   Ectopic  1   Multiple  0   Live Births  2          Past Medical History:  Diagnosis Date  . Anemia    1st pregn  . Ectopic pregnancy    MTX  . Gestational diabetes    Gestational DM, diet controlled - last pregn  . Hypoglycemia   . Sickle cell trait [facility])    Past Surgical History:  Procedure Laterality Date  . CESAREAN SECTION  07/09/2004   FTP - chorio  . DILATION AND CURETTAGE OF UTERUS     2010  . HERNIA REPAIR     2006   . UMBILICAL HERNIA REPAIR     Family History: family history includes Diabetes in her father; Hepatitis C in her father; Hyperlipidemia in her mother; Sickle cell anemia in her father. Social History:  reports that she quit smoking about 8 years ago. She has never used smokeless tobacco. She reports that she does not drink alcohol or use drugs.  Nursing Staff Provider  Office Location  [facility] Dating  LMP-certain   Language   English Anatomy US  Normal - FU complete - normal  Flu Vaccine  Declined-05/29/18 Genetic Screen  NIPS: low risk, female  AFP:      TDaP vaccine   04/30/18 Hgb A1C or  GTT Fasting BS 78 A1C: 5.0 Third trimester   Ref. Range 04/12/2018 09:16  Glucose, 1 hour Latest Ref Range: 65 - 179 mg/dL 130  Glucose, Fasting Latest Ref Range: 65 - 91 mg/dL 72  Glucose, 2 hour Latest Ref Range: 65 - 152 mg/dL 132    Rhogam  n/a   LAB RESULTS   Contraception  NFP Blood Type O/Positive/-- (03/19 1607)   Circumcision N/a female  Antibody Negative (03/19 1607)  Pediatrician TAPM Rubella 1.73 (03/19 1607)  Support Person  John (FOB) RPR Non Reactive (03/19 1607)   Prenatal Classes offered HBsAg Negative (03/19 1607)   BTL Consent  n/a  HIV Non Reactive (03/19 1607)  VBAC Consent  05/15/18  GBS  negative    Pap Last PAP 2017 and it was normal     Hgb Electro  Sickle Cell  trait     CF     SMA 3 copies    Waterbirth  [ ]  Class [ ]  Consent [ ]  CNM visit      Maternal Diabetes: No Genetic Screening: Normal Maternal Ultrasounds/Referrals: Normal Fetal Ultrasounds or other Referrals:  None Maternal Substance Abuse:  No Significant Maternal Medications:  None Significant Maternal Lab Results:  None Other Comments:  None  Review of Systems  All other systems reviewed and are negative.  Maternal Medical History:  Reason for admission: Rupture of membranes and contractions.   Contractions: Onset was less than 1 hour ago.   Frequency: regular.   Perceived severity is strong.    Fetal activity: Perceived fetal activity is normal.   Last perceived fetal movement was within the past hour.    Prenatal Complications - Diabetes: none.    Dilation: 10 Exam by:: Norman Herrlich CNM Last menstrual period 10/02/2017, unknown if currently breastfeeding. Maternal Exam:  Uterine Assessment:  Contraction strength is firm.  Contraction frequency is regular.   Abdomen: Patient reports no abdominal tenderness. Fetal presentation: vertex  Introitus: Normal vulva. Normal vagina.  Pelvis: adequate for delivery.   Cervix: Cervix evaluated by digital exam.     Fetal Exam Fetal Monitor Review: Mode: ultrasound.   Baseline rate: 135.  Variability: moderate (6-25 bpm).    Fetal State Assessment: Category I - tracings are normal.     Physical Exam  Nursing note and vitals reviewed. Constitutional: She is oriented to person, place, and time. She appears well-developed and well-nourished. No distress.  HENT:  Head: Normocephalic.  Cardiovascular: Normal rate.  Respiratory: Effort normal.  GI: Soft. There is no tenderness.  Neurological: She is alert and oriented to person, place, and time.  Skin: Skin is warm and dry.  Psychiatric: She has a normal mood and affect.    Prenatal labs: ABO, Rh: O/Positive/-- (03/19 1607) Antibody: Negative (03/19 1607) Rubella: 1.73  (03/19 1607) RPR: Non Reactive (08/16 0916)  HBsAg: Negative (03/19 1607)  HIV: Non Reactive (08/16 0916)  GBS: Negative (10/17 0000)   Assessment/Plan: Active labor Admit to labor and delivery  Anticipate NSVD    Marcille Buffy 07/02/2018, 8:39 AM

## 2018-07-02 NOTE — MAU Note (Signed)
Assisted to rm via wc.  Water broke at 0700.  States" the baby is coming".  Assisted into bed.  Clear fluid noted, became red tinted. Bloody show, CNM called to rm.

## 2018-07-02 NOTE — Discharge Instructions (Signed)
IF YOUR CONTRACTIONS GET STRONGER OR CLOSER TOGETHER PLEASE COME BACK TO THE MAU AT [facility].   Braxton Hicks Contractions Contractions of the uterus can occur throughout pregnancy, but they are not always a sign that you are in labor. You may have practice contractions called Braxton Hicks contractions. These false labor contractions are sometimes confused with true labor. What are [place] contractions? Braxton Hicks contractions are tightening movements that occur in the muscles of the uterus before labor. Unlike true labor contractions, these contractions do not result in opening (dilation) and thinning of the cervix. Toward the end of pregnancy (32-34 weeks), Braxton Hicks contractions can happen more often and may become stronger. These contractions are sometimes difficult to tell apart from true labor because they can be very uncomfortable. You should not feel embarrassed if you go to the hospital with false labor. Sometimes, the only way to tell if you are in true labor is for your health care provider to look for changes in the cervix. The health care provider will do a physical exam and may monitor your contractions. If you are not in true labor, the exam should show that your cervix is not dilating and your water has not broken. If there are other health problems associated with your pregnancy, it is completely safe for you to be sent home with false labor. You may continue to have Braxton Hicks contractions until you go into true labor. How to tell the difference between true labor and false labor True labor  Contractions last 30-70 seconds.  Contractions become very regular.  Discomfort is usually felt in the top of the uterus, and it spreads to the lower abdomen and low back.  Contractions do not go away with walking.  Contractions usually become more intense and increase in frequency.  The cervix dilates and gets thinner. False labor  Contractions are usually  shorter and not as strong as true labor contractions.  Contractions are usually irregular.  Contractions are often felt in the front of the lower abdomen and in the groin.  Contractions may go away when you walk around or change positions while lying down.  Contractions get weaker and are shorter-lasting as time goes on.  The cervix usually does not dilate or become thin. Follow these instructions at home:  Take over-the-counter and prescription medicines only as told by your health care provider.  Keep up with your usual exercises and follow other instructions from your health care provider.  Eat and drink lightly if you think you are going into labor.  If Braxton Hicks contractions are making you uncomfortable: ? Change your position from lying down or resting to walking, or change from walking to resting. ? Sit and rest in a tub of warm water. ? Drink enough fluid to keep your urine pale yellow. Dehydration may cause these contractions. ? Do slow and deep breathing several times an hour.  Keep all follow-up prenatal visits as told by your health care provider. This is important. Contact a health care provider if:  You have a fever.  You have continuous pain in your abdomen. Get help right away if:  Your contractions become stronger, more regular, and closer together.  You have fluid leaking or gushing from your vagina.  You pass blood-tinged mucus (bloody show).  You have bleeding from your vagina.  You have low back pain that you never had before.  You feel your babys head pushing down and causing pelvic pressure.  Your baby is not  moving inside you as much as it used to. Summary  Contractions that occur before labor are called Braxton Hicks contractions, false labor, or practice contractions.  Braxton Hicks contractions are usually shorter, weaker, farther apart, and less regular than true labor contractions. True labor contractions usually become progressively  stronger and regular and they become more frequent.  Manage discomfort from [facility] contractions by changing position, resting in a warm bath, drinking plenty of water, or practicing deep breathing. This information is not intended to replace advice given to you by your health care provider. Make sure you discuss any questions you have with your health care provider. Document Released: 12/28/2016 Document Revised: 12/28/2016 Document Reviewed: 12/28/2016 Elsevier Interactive Patient Education  2018 [employer].

## 2018-07-02 NOTE — Progress Notes (Signed)
MOB was referred for history of depression/anxiety. * Referral screened out by Clinical Social Worker because none of the following criteria appear to apply: ~ History of anxiety/depression during this pregnancy, or of post-partum depression following prior delivery. ~ Diagnosis of anxiety and/or depression within last 3 years OR * MOB's symptoms currently being treated with medication and/or therapy. Please contact the Clinical Social Worker if needs arise, by MOB request, or if MOB scores greater than 9/yes to question 10 on Edinburgh Postpartum Depression Screen.  Asianna Brundage, LCSW Clinical Social Worker  [facility]  [phone]  

## 2018-07-03 LAB — CBC
HEMATOCRIT: 33 % — AB (ref 36.0–46.0)
Hemoglobin: 11.1 g/dL — ABNORMAL LOW (ref 12.0–15.0)
MCH: 30.7 pg (ref 26.0–34.0)
MCHC: 33.6 g/dL (ref 30.0–36.0)
MCV: 91.2 fL (ref 80.0–100.0)
NRBC: 0 % (ref 0.0–0.2)
Platelets: 166 10*3/uL (ref 150–400)
RBC: 3.62 MIL/uL — ABNORMAL LOW (ref 3.87–5.11)
RDW: 14.9 % (ref 11.5–15.5)
WBC: 7.2 10*3/uL (ref 4.0–10.5)

## 2018-07-03 MED ORDER — ASPIRIN EC 81 MG PO TBEC
81.0000 mg | DELAYED_RELEASE_TABLET | Freq: Every day | ORAL | Status: DC
  Administered 2018-07-03: 81 mg via ORAL
  Filled 2018-07-03 (×2): qty 1

## 2018-07-03 NOTE — Discharge Summary (Signed)
Obstetrics Discharge Summary OB/GYN Faculty Practice   Patient Name: Amanda [name] DOB: 08-21-1986 MRN: [id number]  Date of admission: 07/02/2018 Delivering MD: Marcille Buffy D   Date of discharge: 07/03/2018  Admitting diagnosis: WATER BROKE Intrauterine pregnancy: [redacted]w[redacted]d     Secondary diagnosis:   Active Problems:   NSVD (normal spontaneous vaginal delivery)   Additional problems:  . None     Discharge diagnosis: Term Pregnancy Delivered                                            Postpartum procedures: None  Complications:   Hospital course: [name] is a 32 y.o. [redacted]w[redacted]d who was admitted for SOL. 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 Her labor course was notable for precipitous delivery. Delivery was uncomplicated. Please see delivery note for additional details. Her postpartum course was uncomplicated. She was breastfeeding without difficulty. By day of discharge, she was passing flatus, urinating, eating and drinking without difficulty. Her pain was well-controlled, and she was discharged home. She will follow-up in clinic  weeks.   Physical exam  Vitals:   07/02/18 1500 07/02/18 1901 07/02/18 2300 07/03/18 0523  BP: 96/69 99/66 (!) 89/66 102/66  Pulse: 90 (!) 104 92 97  Resp: 18 20 18 20   Temp: 98.1 F (36.7 C) 98.6 F (37 C) 98.2 F (36.8 C) 98.5 F (36.9 C)  TempSrc: Oral Oral Oral Oral  SpO2: 99% 99% 100% 100%    Lab Results  Component Value Date   WBC 4.8 04/12/2018   HGB 10.9 (L) 04/12/2018   HCT 31.2 (L) 04/12/2018   MCV 85 04/12/2018   PLT 159 04/12/2018   CMP Latest Ref Rng & Units 02/21/2016  Glucose 65 - 104 mg/dL 83  BUN 6 - 20 mg/dL -  Creatinine 0.44 - 1.00 mg/dL -  Sodium 135 - 145 mmol/L -  Potassium 3.5 - 5.1 mmol/L -  Chloride 101 - 111 mmol/L -  CO2 22 - 32 mmol/L -  Calcium 8.9 - 10.3 mg/dL -  Total Protein 6.5 - 8.1 g/dL -  Total Bilirubin 0.3 - 1.2 mg/dL -  Alkaline Phos 38 - 126 U/L -  AST  15 - 41 U/L -  ALT 14 - 54 U/L -    Discharge instructions: Per After Visit Summary and "Baby and Me Booklet"  After visit meds:   Postpartum contraception: Not Discussed Diet: Routine Diet Activity: Advance as tolerated. Pelvic rest for 6 weeks.   Outpatient follow up:4 weeks Follow-up Appt: Future Appointments  Date Time Provider [place]  07/05/2018 10:55 AM Jorje Guild, NP [place] [facility]   Follow-up Visit:No follow-ups on file.  Newborn Data: Live born female  Birth Weight: 7 lb 10 oz (3459 g) APGAR: 38, 9  Newborn Delivery   Birth date/time:  07/02/2018 08:11:00 Delivery type:  VBAC, Spontaneous     Baby Feeding:    Disposition:home with mother   OB/GYN Fellow, Faculty Practice

## 2018-07-03 NOTE — Lactation Note (Signed)
This note was copied from a baby's chart. Lactation Consultation Note Late entry. Mom's 3rd child. BF her last child 2 yrs Baby is DAT + on DPT. Discussed w/mom supplementation d/t elevated bili. Mom stated yes to BM, no to formula.  Discussed w/mom using DEBP, mom agreed. Mom knows to pump q3h for 15-20 min.  Warren asked mom has she seen any colostrum. Mom stated she didn't know. Asked mom to demonstrated hand expression. Mom stated she didn't know how to, she has always had trouble hand expressing. Demonstrated hand expression.  Has colostrum noted.  Mom stated that her Lt. Nipple is sore, pinches when baby BF but doesn't hurt on the Rt. Nipples. Discussed feeding options. Mom doesn't want to use formula or bottles. LC gave mom curve tip syring discussed how to feed w/finger and curve tip syring. Asked mom to call RN when ready to feed.  Mom stated she had a lot of help BF w/her last child and needs help with this child. Encouraged mom to call for assistance. Discussed importance of feeding baby w/lights on baby while cluster feeding. Discussed positioning. Discussed football, cradle, cross cradle. Mom stated she sucks at the football position. Discussed support, comfort, hand expression, STS, I&O, supply and demand. Mom encouraged to feed baby 8-12 times/24 hours and with feeding cues.  Encouraged to call for assistance or questions.  [place] brochure given w/resources, support groups and [place] services.  Patient Name: Amanda [name] MEQAS'T Date: 07/03/2018 Reason for consult: Initial assessment;Hyperbilirubinemia   Maternal Data Has patient been taught Hand Expression?: Yes Does the patient have breastfeeding experience prior to this delivery?: Yes  Feeding    LATCH Score       Type of Nipple: Everted at rest and after stimulation  Comfort (Breast/Nipple): Filling, red/small blisters or bruises, mild/mod discomfort        Interventions Interventions: Breast feeding basics  reviewed;Support pillows;Position options;Skin to skin;Breast massage;Hand express;Comfort gels;Breast compression;DEBP  Lactation Tools Discussed/Used Tools: Pump;Comfort gels Breast pump type: Double-Electric Breast Pump Pump Review: Milk Storage(LC milk storage)   Consult Status Consult Status: Follow-up Date: 07/04/18 Follow-up type: In-patient    Theodoro Kalata 07/03/2018, 9:49 AM

## 2018-07-03 NOTE — Progress Notes (Signed)
POSTPARTUM PROGRESS NOTE  Post Partum Day1  Subjective:  Amanda [name] is a 32 y.o. [id number] s/p NSVD at [redacted]w[redacted]d.  She reports she is doing well. No acute events overnight. She denies any problems with ambulating, voiding or po intake. Denies nausea or vomiting.  Pain is well controlled.  Lochia is improving. C/o 3/10 uterus pain, otherwise doing well. Reports BM after delivery without difficulty.   Objective: Blood pressure 102/66, pulse 97, temperature 98.5 F (36.9 C), temperature source Oral, resp. rate 20, last menstrual period 10/02/2017, SpO2 100 %, unknown if currently breastfeeding.  Physical Exam:  General: alert, cooperative and no distress Chest: no respiratory distress Heart:regular rate, distal pulses intact Abdomen: soft, nontender,  Uterine Fundus: firm, appropriately tender DVT Evaluation: No calf swelling or tenderness Extremities: No edema Skin: warm, dry  No results for input(s): HGB, HCT in the last 72 hours.  Assessment/Plan: Amanda [name] is a 32 y.o. [phone] s/p SVD at [redacted]w[redacted]d   PPD#1 - Doing well  Routine postpartum care  Contraception: Condoms Feeding: Breast  Dispo: Plan for discharge tomorrow.   LOS: 1 day   Ferrel Logan PA-S, 07/03/2018  Midwife attestation Post Partum Day 1 I have seen and examined this patient and agree with above documentation in the student's note.   Amanda [name] is a 32 y.o. [id number] s/p VBAC.  Pt denies problems with ambulating, voiding or po intake. Pain is well controlled. Method of Feeding: breast  PE:  Gen: well appearing Heart: reg rate Lungs: normal WOB Fundus firm Ext: soft, no pain, no edema  Assessment: S/p VBAC PPD #1  Plan for discharge: tomorrow  Julianne Handler, CNM 10:51 AM

## 2018-07-04 LAB — RPR: RPR: NONREACTIVE

## 2018-07-04 MED ORDER — IBUPROFEN 600 MG PO TABS: 600.0000 mg | ORAL_TABLET | Freq: Four times a day (QID) | ORAL | 0 refills | Status: DC

## 2018-07-04 NOTE — Discharge Summary (Addendum)
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 yo [id number] at [redacted]w[redacted]d was admitted in Active Labor on 07/02/2018. Patient had an uncomplicated labor course as follows:  Membrane Rupture Time/Date: 7:00 AM ,07/02/2018   Intrapartum Procedures: Episiotomy: None [1]                                         Lacerations:  2nd degree [3]  Patient had a delivery of a Viable infant. 07/02/2018  Information for the patient's newborn:  Shaneil, Yazdi Girl Natale [[id number]]  Delivery Method: VBAC, Spontaneous(Filed from Delivery Summary)    Pateint had an uncomplicated postpartum course.  She is ambulating, tolerating a regular diet, passing flatus, and urinating well. Patient is discharged home in stable condition on 07/04/18.   Magnesium Sulfate recieved: No BMZ received: No  Physical exam  Vitals:   07/02/18 2300 07/03/18 0523 07/03/18 2300 07/04/18 0503  BP: (!) 89/66 102/66 97/63 (!) 88/61  Pulse: 92 97 90 90  Resp: 18 20 18 18   Temp: 98.2 F (36.8 C) 98.5 F (36.9 C) 98.5 F (36.9 C) 98.3 F (36.8 C)  TempSrc: Oral Oral Oral Oral  SpO2: 100% 100% 100% 98%   General: alert, cooperative and no distress Lochia: appropriate Uterine Fundus: firm Incision:  N/A DVT Evaluation: No evidence of DVT seen on physical exam. No significant calf/ankle edema. Labs: Lab Results  Component Value Date   WBC 7.2 07/03/2018   HGB 11.1 (L) 07/03/2018   HCT 33.0 (L) 07/03/2018   MCV 91.2 07/03/2018   PLT 166 07/03/2018   CMP Latest Ref Rng & Units 02/21/2016  Glucose 65 - 104 mg/dL 83  BUN 6 - 20 mg/dL -  Creatinine 0.44 - 1.00 mg/dL -  Sodium 135 - 145 mmol/L -  Potassium 3.5 - 5.1 mmol/L -  Chloride 101 - 111 mmol/L -  CO2 22 - 32 mmol/L -  Calcium 8.9 - 10.3 mg/dL -  Total Protein 6.5 - 8.1 g/dL -  Total Bilirubin 0.3 - 1.2 mg/dL -  Alkaline Phos 38 - 126 U/L -  AST 15 - 41 U/L -  ALT 14 - 54 U/L -    Discharge instruction: per After Visit Summary and "  Baby and Me Booklet".  After visit meds:  Allergies as of 07/04/2018   No Known Allergies     Medication List    STOP taking these medications   aspirin 81 MG chewable tablet     TAKE these medications   ibuprofen 600 MG tablet Commonly known as:  ADVIL,MOTRIN Take 1 tablet (600 mg total) by mouth every 6 (six) hours.   prenatal multivitamin Tabs tablet Take 1 tablet by mouth at bedtime.   valACYclovir 500 MG tablet Commonly known as:  VALTREX Take 1 tablet (500 mg total) by mouth 2 (two) times daily. To start after completing Valtrex 1000mg  daily x 5 days.       Diet: routine diet  Activity: Advance as tolerated. Pelvic rest for 6 weeks.   Outpatient follow up:4 weeks Follow up Appt: Future Appointments  Date Time Provider [place]  08/13/2018  2:35 PM Tamala Julian [place], [place] [facility]      Newborn Data: Live born female  Birth Weight: 7 lb 10 oz (3459 g) APGAR: 63, 9  Newborn Delivery   Birth date/time:  07/02/2018 08:11:00 Delivery type:  VBAC, Spontaneous     Baby Feeding: Breast Disposition:home with mother  Birth control: condoms/family planning, aware of her birth control options available, declines at this time.    07/04/2018 Patriciaann Clan, DO  OB FELLOW DISCHARGE ATTESTATION  I have seen and examined this patient and agree with above documentation in the resident's note.   Aura Camps, MD OB Fellow  07/04/2018, 9:21 AM

## 2018-07-05 ENCOUNTER — Encounter: Payer: Medicaid Other | Admitting: Student

## 2018-07-05 ENCOUNTER — Ambulatory Visit: Payer: Self-pay

## 2018-07-05 NOTE — Lactation Note (Signed)
This note was copied from a baby's chart. Lactation Consultation Note  Patient Name: Amanda [name] ACZYS'A Date: 07/05/2018   Mom's milk has come to volume. Infant was observed at breast. "Vani" initially had difficulty with latching, but once Mom was able to soften her breast some, she latched with ease & was transferring milk (swallows verified by cervical auscultation). Hand expression was also taught to Mom & the resulting EBM was finger-fed to infant via curved-tip syringe (prior to doing the pumping that helped to soften the breast).   She was most recently able to pump almost 3.5 oz in about 10 minutes. Audrea Muscat has gained more than 2 oz over the last 24 hours.   Breast pump parts were washed & the hand-out from the CDC, "How to Keep Your Breast Pump Kit Clean," was provided to Mom. Mom plans to sanitize her pump parts at home by boiling.    Matthias [facility] 07/05/2018, 10:46 AM

## 2018-07-06 ENCOUNTER — Ambulatory Visit: Payer: Self-pay

## 2018-07-06 NOTE — Lactation Note (Signed)
This note was copied from a baby's chart. Lactation Consultation Note  Patient Name: Amanda [name] LGSPJ'S Date: 07/06/2018 Reason for consult: Follow-up assessment   43 73 days old on phototherapy. Mother is pumping between 70 ml - 100 ml. She is supplementing with her breastmilk after breastfeeding. Mother has personal DEBP at home. Mom encouraged to feed baby 8-12 times/24 hours and with feeding cues.  Reviewed engorgement care and monitoring voids/stools.    Maternal Data    Feeding    LATCH Score                   Interventions Interventions: DEBP;Breast feeding basics reviewed  Lactation Tools Discussed/Used     Consult Status Consult Status: Complete Date: 07/06/18    Vivianne Master [facility] 07/06/2018, 9:17 AM

## 2018-07-07 ENCOUNTER — Ambulatory Visit: Payer: Self-pay

## 2018-07-07 NOTE — Lactation Note (Addendum)
This note was copied from a baby's chart. Lactation Consultation Note  Patient Name: Amanda [name] CLEXN'T Date: 07/07/2018  Mom eating breakfast on arrival.  Infant off phototheraphy. Mom reports she is breastfeeding well.  Mom reports nipples sore but intact.  Gave coconut oil and encouraged mom to hand express and rub ebm on nipples air dry and then apply coconut oil. Mom reports hx mastitis with last baby.  Mom reports she got mastitis when they baby was about 1 week.  Mom reports horrible engorgement with that baby.  Discussed mom and baby getting in rhythm with each other and gradually decreasing the pumping past bf.  Mom reports her milk has come to volume and she is pumping large amounts.  Reports has 6 bottles in fridge of 60-90 ml each.. Discussed different scenarios to decrease pumpings past breastfeeding.  Discussed decreasing time pumping and only pumping to comfort past bf if baby does not soften breast to comfort.  Discussed elimnating one pump a day gradually till not pumping past breastfeeds.  Mom plans to stay home with her. Urged mom to follow up as needed.   Maternal Data    Feeding Feeding Type: Breast Fed  LATCH Score                   Interventions    Lactation Tools Discussed/Used     Consult Status      [name] 07/07/2018, 9:41 AM

## 2018-07-23 ENCOUNTER — Other Ambulatory Visit: Payer: Self-pay | Admitting: Family Medicine

## 2018-07-26 ENCOUNTER — Other Ambulatory Visit: Payer: Self-pay | Admitting: Family Medicine

## 2018-08-13 ENCOUNTER — Encounter: Payer: Self-pay | Admitting: Advanced Practice Midwife

## 2018-08-13 ENCOUNTER — Ambulatory Visit (INDEPENDENT_AMBULATORY_CARE_PROVIDER_SITE_OTHER): Payer: Medicaid Other | Admitting: Advanced Practice Midwife

## 2018-08-13 VITALS — BP 110/77 | HR 83 | Wt 157.0 lb

## 2018-08-13 DIAGNOSIS — Z1389 Encounter for screening for other disorder: Secondary | ICD-10-CM | POA: Diagnosis not present

## 2018-08-13 DIAGNOSIS — Z98891 History of uterine scar from previous surgery: Secondary | ICD-10-CM

## 2018-08-13 DIAGNOSIS — M6208 Separation of muscle (nontraumatic), other site: Secondary | ICD-10-CM

## 2018-08-13 NOTE — Patient Instructions (Signed)
Diastasis Recti Diastasis recti is when the muscles of the abdomen (rectus abdominis muscles) become thin and separate. The result is a wider space between the right and left abdomen (abdominal) muscles. This wider space between the muscles may cause a bulge in the middle of your abdomen. You may notice this bulge when you are straining or when you sit up from a lying down position. Diastasis recti can affect men and women. It is most common among pregnant women, infants, people who are obese, and people who have had abdominal surgery. Exercise or surgical treatment may help correct it. What are the causes? Common causes of this condition include:  Pregnancy. The growing uterus puts pressure on the abdominal muscles, which causes the muscles to separate.  Obesity. Excess fat puts pressure on abdominal muscles.  Weightlifting.  Some abdomen exercises.  Advanced age.  Genetics.  Prior abdominal surgery.  What increases the risk? This condition is more likely to develop in:  Women.  Newborns, especially newborns who are born early (prematurely).  What are the signs or symptoms? Common symptoms of this condition include:  A bulge in the middle of the abdomen. You will notice it most when you sit up or strain.  Pain in the low back, pelvis, or hips.  Constipation.  Inability to control when you urinate (urinary incontinence).  Bloating.  Poor posture.  How is this diagnosed? This condition is diagnosed with a physical exam. Your health care provider will ask you to lie flat on your back and do a crunch or half sit-up. If you have diastasis recti, a vertical bulge will appear between your abdominal muscles in the center of your abdomen. Your health care provider will measure the gap between your muscles with one of the following:  A medical device used to measure the space between two objects (caliper).  A tape measure.  CT scan.  Ultrasound.  Finger spaces. Your health  care provider will measure the space using their fingers.  How is this treated? If your muscle separation is not too large, you may not need treatment. However, if you are a woman who plans to become pregnant again, you should treat this condition before your next pregnancy. Treatment may include:  Physical therapy to strengthen and tighten your abdominal muscles.  Lifestyle changes such as weight loss and exercise.  Over-the-counter pain medicines as needed.  Surgery to correct the separation.  Follow these instructions at home: Activity  Return to your normal activities as told by your health care provider. Ask your health care provider what activities are safe for you.  When lifting weights or doing exercises using your abdominal muscles or the muscles in the center of your body that give stability (core muscles), make sure you are doing your exercises and movements correctly. Proper form can help to prevent the condition from happening again. General instructions  If you are overweight, ask your health care provider for help with weight loss. Losing even a small amount of weight can help to improve your diastasis recti.  Take over-the-counter or prescription medicines only as told by your health care provider.  Do not strain. Straining can make the separation worse. Examples of straining include: ? Pushing hard to have a bowel movement, such as due to constipation. ? Lifting heavy objects, including children. ? Standing up and sitting down.  Take steps to prevent constipation: ? Drink enough fluid to keep your urine clear or pale yellow. ? Take over-the-counter or prescription medicines only as  directed. ? Eat foods that are high in fiber, such as fresh fruits and vegetables, whole grains, and beans. ? Limit foods that are high in fat and processed sugars, such as fried and sweet foods. Contact a health care provider if:  You notice a new bulge in your abdomen. Get help  right away if:  You experience severe discomfort in your abdomen.  You develop severe abdominal pain along with nausea, vomiting, or fever. Summary  Diastasis recti is when the abdomen (abdominal) muscles become thin and separate. Your abdomen will stick out because the space between your right and left abdomen muscles has widened.  The most common symptom is a bulge in your abdomen. You will notice it most when you sit up or are straining.  This condition is diagnosed during a physical exam.  If the abdomen separation is not too big, you may choose not to have treatment. Otherwise, you may need to undergo physical therapy or surgery. This information is not intended to replace advice given to you by your health care provider. Make sure you discuss any questions you have with your health care provider. Document Released: 10/09/2016 Document Revised: 10/09/2016 Document Reviewed: 10/09/2016 Elsevier Interactive Patient Education  [employer].

## 2018-08-13 NOTE — Progress Notes (Signed)
Subjective:     Amanda [name] is a 32 y.o. female who presents for a postpartum visit. She is 6 weeks postpartum following a spontaneous vaginal delivery. I have fully reviewed the prenatal and intrapartum course. The delivery was at 74 gestational weeks. Outcome: spontaneous vaginal delivery. Anesthesia: local. Postpartum course has been unremarkable. Baby's course has been uncomplicated. Baby is feeding by breast. Bleeding no bleeding. Bowel function is normal. Bladder function is normal. Patient is sexually active. Contraception method is none. Postpartum depression screening: negative.  The following portions of the patient's history were reviewed and updated as appropriate: allergies, current medications, past family history, past medical history, past social history, past surgical history and problem list. Last Pap 2017, Nml.   Review of Systems mild soreness in midline of abd. Denies fever, chills, urinary complaints, N/V/D/C, VB, vaginal discharge, or abs mass.   Objective:    LMP 10/02/2017 (Exact Date)   General:  alert, cooperative, appears stated age and no distress   Breasts:  Declined  Lungs: clear to auscultation bilaterally  Heart:  regular rate and rhythm, S1, S2 normal, no murmur, click, rub or gallop  Abdomen: soft, non-tender; bowel sounds normal; no masses,  no organomegaly 4 finger-width diastasis rectus. NO palpable hernia.    Vulva:  normal  Vagina: normal vagina, no discharge, exudate, lesion, or erythema  Cervix:  no cervical motion tenderness  Corpus: normal size, contour, position, consistency, mobility, non-tender  Adnexa:  no mass, fullness, tenderness  Rectal Exam: Not performed.        Assessment:   1. Rectus diastasis   2. Postpartum care and examination - Pap not done/indicated  Plan:    1. Contraception: condoms 2. Discussed abd exercise and PT for Diastasis Rectus.  3. Follow up in: 1 year or as needed.

## 2019-06-22 ENCOUNTER — Emergency Department (HOSPITAL_COMMUNITY): Payer: Medicaid Other

## 2019-06-22 ENCOUNTER — Encounter (HOSPITAL_COMMUNITY): Payer: Self-pay | Admitting: Emergency Medicine

## 2019-06-22 ENCOUNTER — Emergency Department (HOSPITAL_COMMUNITY)
Admission: EM | Admit: 2019-06-22 | Discharge: 2019-06-22 | Disposition: A | Discharge status: Home / Self Care | Payer: Medicaid Other | Attending: Emergency Medicine | Admitting: Emergency Medicine

## 2019-06-22 DIAGNOSIS — X509XXA Other and unspecified overexertion or strenuous movements or postures, initial encounter: Secondary | ICD-10-CM | POA: Diagnosis not present

## 2019-06-22 DIAGNOSIS — S93401A Sprain of unspecified ligament of right ankle, initial encounter: Secondary | ICD-10-CM

## 2019-06-22 DIAGNOSIS — Y929 Unspecified place or not applicable: Secondary | ICD-10-CM | POA: Insufficient documentation

## 2019-06-22 DIAGNOSIS — Z87891 Personal history of nicotine dependence: Secondary | ICD-10-CM | POA: Diagnosis not present

## 2019-06-22 DIAGNOSIS — Y9389 Activity, other specified: Secondary | ICD-10-CM | POA: Insufficient documentation

## 2019-06-22 DIAGNOSIS — Y999 Unspecified external cause status: Secondary | ICD-10-CM | POA: Insufficient documentation

## 2019-06-22 DIAGNOSIS — S99911A Unspecified injury of right ankle, initial encounter: Secondary | ICD-10-CM | POA: Diagnosis present

## 2019-06-22 MED ORDER — IBUPROFEN 400 MG PO TABS
600.0000 mg | ORAL_TABLET | Freq: Once | ORAL | Status: AC
  Administered 2019-06-22: 600 mg via ORAL
  Filled 2019-06-22: qty 1

## 2019-06-22 NOTE — Progress Notes (Signed)
Orthopedic Tech Progress Note Patient Details:  Amanda [name] 1985/10/02 [id number]  Ortho Devices Type of Ortho Device: ASO, Crutches Ortho Device/Splint Interventions: Adjustment, Application, Ordered   Post Interventions Patient Tolerated: Well, Ambulated well Instructions Provided: Care of device, Adjustment of device, Poper ambulation with device   Janit Pagan 06/22/2019, 8:14 PM

## 2019-06-22 NOTE — ED Provider Notes (Signed)
[facility] EMERGENCY DEPARTMENT Provider Note   CSN: [id number] Arrival date & time: 06/22/19  1659     History   Chief Complaint Chief Complaint  Patient presents with  . Foot Injury    HPI Amanda [name] is a 33 y.o. female.     Amanda [name] is a 33 y.o. female who presents to the emergency department for evaluation of right foot and ankle pain.  She reports around 5 PM this evening she was playing with her child when she fell rolling her right ankle.  Pain is located over the top of the proximal foot and lateral portion of the right ankle.  She has noted a small amount of swelling.  Pain is worse with weightbearing.  Range of motion limited due to pain.  No pain at the knee.  No other injuries from the fall.  No numbness or weakness in the ankle or foot.  She has not taken anything for her symptoms prior to arrival.  No previous surgery or injury.  No other aggravating or alleviating factors.     Past Medical History:  Diagnosis Date  . Anemia    1st pregn  . Ectopic pregnancy    MTX  . Gestational diabetes    Gestational DM, diet controlled - last pregn  . Hypoglycemia   . Sickle cell trait [facility])     Patient Active Problem List   Diagnosis Date Noted  . Sciatic leg pain 05/29/2018  . History of cesarean delivery 12/19/2017  . History of gestational diabetes mellitus (GDM) 03/06/2016  . Anxiety state 11/18/2015  . Depression 11/18/2015  . Sickle Cell Trait  Hemoglobin A-S genotype ([place]) 09/26/2015  . HSV (herpes simplex virus) anogenital infection 07/12/2014    Past Surgical History:  Procedure Laterality Date  . CESAREAN SECTION  07/09/2004   FTP - chorio  . DILATION AND CURETTAGE OF UTERUS     2010  . HERNIA REPAIR     2006   . UMBILICAL HERNIA REPAIR       OB History    Gravida  6   Para  3   Term  3   Preterm      AB  3   Living  3     SAB  1   TAB  1   Ectopic  1   Multiple  0   Live Births  3             Home Medications    Prior to Admission medications   Medication Sig Start Date End Date Taking? Authorizing Provider  ibuprofen (ADVIL,MOTRIN) 600 MG tablet Take 1 tablet (600 mg total) by mouth every 6 (six) hours. Patient not taking: Reported on 08/13/2018 07/04/18   Patriciaann Clan, DO  Prenatal Vit-Fe Fumarate-FA (PRENATAL MULTIVITAMIN) TABS tablet Take 1 tablet by mouth at bedtime.    [provider]  valACYclovir (VALTREX) 500 MG tablet Take 1 tablet (500 mg total) by mouth 2 (two) times daily. To start after completing Valtrex 1000mg  daily x 5 days. Patient not taking: Reported on 08/13/2018 05/15/18   Virginia Rochester, NP    Family History Family History  Problem Relation Age of Onset  . Hyperlipidemia Mother   . Diabetes Father   . Hepatitis C Father   . Sickle cell anemia Father     Social History Social History   Tobacco Use  . Smoking status: Former Smoker    Quit date: 11/18/2009  Years since quitting: 9.6  . Smokeless tobacco: Never Used  Substance Use Topics  . Alcohol use: No  . Drug use: No     Allergies   Patient has no known allergies.   Review of Systems Review of Systems  Constitutional: Negative for chills and fever.  Musculoskeletal: Positive for arthralgias and joint swelling.  Skin: Negative for color change and wound.  Neurological: Negative for weakness and numbness.     Physical Exam Updated Vital Signs BP 109/84   Pulse 99   Temp 98.4 F (36.9 C) (Oral)   Resp 16   Ht 5\' 5"  (1.651 m)   Wt 65.8 kg   LMP 06/22/2019   SpO2 96%   BMI 24.13 kg/m   Physical Exam Vitals signs and nursing note reviewed.  Constitutional:      General: She is not in acute distress.    Appearance: She is well-developed. She is not diaphoretic.  HENT:     Head: Normocephalic and atraumatic.  Eyes:     General:        Right eye: No discharge.        Left eye: No discharge.  Pulmonary:     Effort: Pulmonary effort is normal. No  respiratory distress.  Musculoskeletal:     Comments: There is swelling and tenderness over the right lateral malleolus.  There is also some tenderness over the proximal top of the right foot.  No overt deformity. No tenderness over the medial aspect of the ankle. The fifth metatarsal is not tender. The ankle joint is intact without excessive opening on stressing.  2+ distal pulses, good cap refill, normal sensation and strength.  Range of motion limited due to pain.   Skin:    General: Skin is warm and dry.     Capillary Refill: Capillary refill takes less than 2 seconds.  Neurological:     Mental Status: She is alert and oriented to person, place, and time.     Coordination: Coordination normal.  Psychiatric:        Mood and Affect: Mood normal.        Behavior: Behavior normal.      ED Treatments / Results  Labs (all labs ordered are listed, but only abnormal results are displayed) Labs Reviewed - No data to display  EKG None  Radiology Dg Tibia/fibula Right  Result Date: 06/22/2019 CLINICAL DATA:  Fall, pain EXAM: RIGHT TIBIA AND FIBULA - 2 VIEW; RIGHT ANKLE - COMPLETE 3+ VIEW; RIGHT FOOT COMPLETE - 3+ VIEW COMPARISON:  None. FINDINGS: No fracture or dislocation of the right tibia or fibula. The partially included knee joint is unremarkable. No fracture or dislocation of the right ankle. Ankle mortise is well preserved. No fracture or dislocation of the right foot. Mild first metatarsophalangeal joint arthrosis. Joint spaces are otherwise well preserved. Soft tissues are unremarkable throughout. IMPRESSION: 1. No fracture or dislocation of the right tibia or fibula. 2.  No fracture or dislocation of the right ankle. 3.  No fracture or dislocation of the right foot. Electronically Signed   By: Eddie Candle M.D.   On: 06/22/2019 18:17   Dg Ankle Complete Right  Result Date: 06/22/2019 CLINICAL DATA:  Fall, pain EXAM: RIGHT TIBIA AND FIBULA - 2 VIEW; RIGHT ANKLE - COMPLETE 3+ VIEW;  RIGHT FOOT COMPLETE - 3+ VIEW COMPARISON:  None. FINDINGS: No fracture or dislocation of the right tibia or fibula. The partially included knee joint is unremarkable. No fracture or dislocation of  the right ankle. Ankle mortise is well preserved. No fracture or dislocation of the right foot. Mild first metatarsophalangeal joint arthrosis. Joint spaces are otherwise well preserved. Soft tissues are unremarkable throughout. IMPRESSION: 1. No fracture or dislocation of the right tibia or fibula. 2.  No fracture or dislocation of the right ankle. 3.  No fracture or dislocation of the right foot. Electronically Signed   By: Eddie Candle M.D.   On: 06/22/2019 18:17   Dg Foot Complete Right  Result Date: 06/22/2019 CLINICAL DATA:  Fall, pain EXAM: RIGHT TIBIA AND FIBULA - 2 VIEW; RIGHT ANKLE - COMPLETE 3+ VIEW; RIGHT FOOT COMPLETE - 3+ VIEW COMPARISON:  None. FINDINGS: No fracture or dislocation of the right tibia or fibula. The partially included knee joint is unremarkable. No fracture or dislocation of the right ankle. Ankle mortise is well preserved. No fracture or dislocation of the right foot. Mild first metatarsophalangeal joint arthrosis. Joint spaces are otherwise well preserved. Soft tissues are unremarkable throughout. IMPRESSION: 1. No fracture or dislocation of the right tibia or fibula. 2.  No fracture or dislocation of the right ankle. 3.  No fracture or dislocation of the right foot. Electronically Signed   By: Eddie Candle M.D.   On: 06/22/2019 18:17    Procedures Procedures (including critical care time)  Medications Ordered in ED Medications  ibuprofen (ADVIL) tablet 600 mg (600 mg Oral Given 06/22/19 1958)     Initial Impression / Assessment and Plan / ED Course  I have reviewed the triage vital signs and the nursing notes.  Pertinent labs & imaging results that were available during my care of the patient were reviewed by me and considered in my medical decision making (see chart for  details).  33 year old female presents reporting right foot and ankle pain after fall while playing with her child.  No other injuries from fall.  No obvious deformity on exam and the right foot and ankle are neurovascularly intact.  X-rays of the right foot, ankle and tib-fib ordered in triage prior to my evaluation which showed no evidence of fracture or dislocation.  Exam is consistent with ankle sprain.  Patient was treated with ASO, crutches, NSAIDs and Tylenol for pain.  Encouraged to use ice and elevation, weightbearing as tolerated as pain improved.  Sports medicine follow-up if not improving in 1 week.  Return precautions discussed.  Patient expresses understanding and agreement.  Discharged home in good condition.  Final Clinical Impressions(s) / ED Diagnoses   Final diagnoses:  Sprain of right ankle, unspecified ligament, initial encounter    ED Discharge Orders    None       Jacqlyn Larsen, PA-C 06/23/19 [place], [place], DO 06/25/19 1232

## 2019-06-22 NOTE — ED Notes (Signed)
Paged ortho 

## 2019-06-22 NOTE — Discharge Instructions (Signed)
Use ibuprofen and Tylenol every 6 hours for pain, use crutches after a few days you can bear weight as tolerated.  Ice and elevate the ankle.  Follow-up with sports medicine if not improving in about 1 week.

## 2019-06-22 NOTE — ED Notes (Signed)
Pt c/o rt foot and ankle pain she did at 1700 while playinf with a child  Pain laterally

## 2019-06-22 NOTE — ED Triage Notes (Signed)
Patient states she was playing with her son when she fell and hurt her right ankle. Patient tearful during triage. Redness noted to right leg.

## 2019-06-22 NOTE — ED Notes (Signed)
Patient verbalizes understanding of discharge instructions. Opportunity for questioning and answers were provided. Armband removed by staff, pt discharged from ED.  

## 2019-08-20 ENCOUNTER — Ambulatory Visit: Payer: Medicaid Other

## 2019-08-28 ENCOUNTER — Encounter (HOSPITAL_COMMUNITY): Payer: Self-pay | Admitting: Obstetrics & Gynecology

## 2019-08-28 ENCOUNTER — Other Ambulatory Visit: Payer: Self-pay

## 2019-08-28 ENCOUNTER — Inpatient Hospital Stay (HOSPITAL_COMMUNITY)
Admission: AD | Admit: 2019-08-28 | Discharge: 2019-08-28 | Disposition: A | Discharge status: Home / Self Care | Payer: Medicaid Other | Attending: Obstetrics & Gynecology | Admitting: Obstetrics & Gynecology

## 2019-08-28 ENCOUNTER — Inpatient Hospital Stay (HOSPITAL_COMMUNITY): Payer: Medicaid Other

## 2019-08-28 DIAGNOSIS — O99011 Anemia complicating pregnancy, first trimester: Secondary | ICD-10-CM | POA: Insufficient documentation

## 2019-08-28 DIAGNOSIS — O209 Hemorrhage in early pregnancy, unspecified: Secondary | ICD-10-CM | POA: Insufficient documentation

## 2019-08-28 DIAGNOSIS — O26891 Other specified pregnancy related conditions, first trimester: Secondary | ICD-10-CM | POA: Diagnosis not present

## 2019-08-28 DIAGNOSIS — D573 Sickle-cell trait: Secondary | ICD-10-CM | POA: Diagnosis not present

## 2019-08-28 DIAGNOSIS — Z3A01 Less than 8 weeks gestation of pregnancy: Secondary | ICD-10-CM | POA: Insufficient documentation

## 2019-08-28 DIAGNOSIS — Z3490 Encounter for supervision of normal pregnancy, unspecified, unspecified trimester: Secondary | ICD-10-CM

## 2019-08-28 DIAGNOSIS — Z87891 Personal history of nicotine dependence: Secondary | ICD-10-CM | POA: Insufficient documentation

## 2019-08-28 LAB — HIV ANTIBODY (ROUTINE TESTING W REFLEX): HIV Screen 4th Generation wRfx: NONREACTIVE

## 2019-08-28 LAB — WET PREP, GENITAL
Sperm: NONE SEEN
Trich, Wet Prep: NONE SEEN
Yeast Wet Prep HPF POC: NONE SEEN

## 2019-08-28 LAB — CBC
HCT: 36.2 % (ref 36.0–46.0)
Hemoglobin: 12.3 g/dL (ref 12.0–15.0)
MCH: 28.2 pg (ref 26.0–34.0)
MCHC: 34 g/dL (ref 30.0–36.0)
MCV: 83 fL (ref 80.0–100.0)
Platelets: 219 10*3/uL (ref 150–400)
RBC: 4.36 MIL/uL (ref 3.87–5.11)
RDW: 12.9 % (ref 11.5–15.5)
WBC: 4 10*3/uL (ref 4.0–10.5)
nRBC: 0 % (ref 0.0–0.2)

## 2019-08-28 LAB — HCG, QUANTITATIVE, PREGNANCY: hCG, Beta Chain, Quant, S: 8995 m[IU]/mL — ABNORMAL HIGH (ref <5)

## 2019-08-28 LAB — POCT PREGNANCY, URINE: Preg Test, Ur: POSITIVE — AB

## 2019-08-28 NOTE — MAU Note (Signed)
Has not been seen yet with preg. +HPT.  Started bleeding last night. Light. No pain.

## 2019-08-28 NOTE — Discharge Instructions (Signed)
Subchorionic Hematoma  A subchorionic hematoma is a gathering of blood between the outer wall of the embryo (chorion) and the inner wall of the womb (uterus). This condition can cause vaginal bleeding. If they cause little or no vaginal bleeding, early small hematomas usually shrink on their own and do not affect your baby or pregnancy. When bleeding starts later in pregnancy, or if the hematoma is larger or occurs in older pregnant women, the condition may be more serious. Larger hematomas may get bigger, which increases the chances of miscarriage. This condition also increases the risk of:  Premature separation of the placenta from the uterus.  Premature (preterm) labor.  Stillbirth. What are the causes? The exact cause of this condition is not known. It occurs when blood is trapped between the placenta and the uterine wall because the placenta has separated from the original site of implantation. What increases the risk? You are more likely to develop this condition if:  You were treated with fertility medicines.  You conceived through in vitro fertilization (IVF). What are the signs or symptoms? Symptoms of this condition include:  Vaginal spotting or bleeding.  Contractions of the uterus. These cause abdominal pain. Sometimes you may have no symptoms and the bleeding may only be seen when ultrasound images are taken (transvaginal ultrasound). How is this diagnosed? This condition is diagnosed based on a physical exam. This includes a pelvic exam. You may also have other tests, including:  Blood tests.  Urine tests.  Ultrasound of the abdomen. How is this treated? Treatment for this condition can vary. Treatment may include:  Watchful waiting. You will be monitored closely for any changes in bleeding. During this stage: ? The hematoma may be reabsorbed by the body. ? The hematoma may separate the fluid-filled space containing the embryo (gestational sac) from the wall of the  womb (endometrium).  Medicines.  Activity restriction. This may be needed until the bleeding stops. Follow these instructions at home:  Stay on bed rest if told to do so by your health care provider.  Do not lift anything that is heavier than 10 lbs. (4.5 kg) or as told by your health care provider.  Do not use any products that contain nicotine or tobacco, such as cigarettes and e-cigarettes. If you need help quitting, ask your health care provider.  Track and write down the number of pads you use each day and how soaked (saturated) they are.  Do not use tampons.  Keep all follow-up visits as told by your health care provider. This is important. Your health care provider may ask you to have follow-up blood tests or ultrasound tests or both. Contact a health care provider if:  You have any vaginal bleeding.  You have a fever. Get help right away if:  You have severe cramps in your stomach, back, abdomen, or pelvis.  You pass large clots or tissue. Save any tissue for your health care provider to look at.  You have more vaginal bleeding, and you faint or become lightheaded or weak. Summary  A subchorionic hematoma is a gathering of blood between the outer wall of the placenta and the uterus.  This condition can cause vaginal bleeding.  Sometimes you may have no symptoms and the bleeding may only be seen when ultrasound images are taken.  Treatment may include watchful waiting, medicines, or activity restriction. This information is not intended to replace advice given to you by your health care provider. Make sure you discuss any questions you  have with your health care provider. Document Revised: 07/27/2017 Document Reviewed: 10/10/2016 Elsevier Patient Education  [place]. Threatened Miscarriage  A threatened miscarriage is when you have bleeding from your vagina during the first 20 weeks of pregnancy but the pregnancy does not end. Your doctor will do tests to  make sure you are still pregnant. The cause of the bleeding may not be known. This condition does not mean your pregnancy will end, but it does increase the risk that it will end (complete miscarriage). Follow these instructions at home:  Get plenty of rest.  If you have bleeding in your vagina, do not have sex or use tampons.  Do not douche.  Do not smoke or use drugs.  Do not drink alcohol.  Avoid caffeine.  Keep all follow-up prenatal visits as told by your doctor. This is important. Contact a doctor if:  You have light bleeding from your vagina.  You have belly pain or cramping.  You have a fever. Get help right away if:  You have heavy bleeding from your vagina.  You have clots of blood coming from your vagina.  You pass tissue from your vagina.  You have a gush of fluid from your vagina.  You are leaking fluid from your vagina.  You have very bad pain or cramps in your low back or belly.  You have fever, chills, and very bad belly pain. Summary  A threatened miscarriage is when you have bleeding from your vagina during the first 20 weeks of pregnancy but the pregnancy does not end.  This condition does not mean your pregnancy will end, but it does increase the risk that it will end (complete miscarriage).  Get plenty of rest. If you have bleeding in your vagina, do not have sex or use tampons.  Keep all follow-up prenatal visits as told by your doctor. This is important. This information is not intended to replace advice given to you by your health care provider. Make sure you discuss any questions you have with your health care provider. Document Revised: 09/20/2017 Document Reviewed: 11/10/2016 Elsevier Patient Education  [place].

## 2019-08-28 NOTE — MAU Provider Note (Signed)
History     CSN: [id number]  Arrival date and time: 08/28/19 1013   First Provider Initiated Contact with Patient 08/28/19 1115      Chief Complaint  Patient presents with  . Vaginal Bleeding  . Possible Pregnancy   HPI [name] is a 33 y.o. [id number] at [redacted]w[redacted]d who presents to MAU today with complaint of vaginal bleeding. She states that it started last night. It has been light. She feels there is a little more bleeding today than initially. She denies abdominal pain, but has had some mild low back pain. She denies recent intercourse, UTI symptoms, fever, SOB or CP. She has had some nausea without vomiting, diarrhea or constipation. She states LMP 07/07/19.   OB History    Gravida  7   Para  3   Term  3   Preterm      AB  3   Living  3     SAB  1   TAB  1   Ectopic  1   Multiple  0   Live Births  3           Past Medical History:  Diagnosis Date  . Anemia    1st pregn  . Ectopic pregnancy    MTX  . Gestational diabetes    Gestational DM, diet controlled - last pregn  . Hypoglycemia   . Sickle cell trait [facility])     Past Surgical History:  Procedure Laterality Date  . CESAREAN SECTION  07/09/2004   FTP - chorio  . DILATION AND CURETTAGE OF UTERUS     2010  . HERNIA REPAIR     2006   . UMBILICAL HERNIA REPAIR      Family History  Problem Relation Age of Onset  . Hyperlipidemia Mother   . Diabetes Father   . Hepatitis C Father   . Sickle cell anemia Father     Social History   Tobacco Use  . Smoking status: Former Smoker    Quit date: 11/18/2009    Years since quitting: 9.7  . Smokeless tobacco: Never Used  Substance Use Topics  . Alcohol use: No  . Drug use: No    Allergies: No Known Allergies  Medications Prior to Admission  Medication Sig Dispense Refill Last Dose  . Prenatal Vit-Fe Fumarate-FA (PRENATAL MULTIVITAMIN) TABS tablet Take 1 tablet by mouth at bedtime.   08/27/2019 at Unknown time  . ibuprofen (ADVIL,MOTRIN)  600 MG tablet Take 1 tablet (600 mg total) by mouth every 6 (six) hours. (Patient not taking: Reported on 08/13/2018) 30 tablet 0 More than a month at Unknown time  . valACYclovir (VALTREX) 500 MG tablet Take 1 tablet (500 mg total) by mouth 2 (two) times daily. To start after completing Valtrex 1000mg  daily x 5 days. (Patient not taking: Reported on 08/13/2018) 60 tablet 2 More than a month at Unknown time    Review of Systems  Constitutional: Negative for chills and fever.  Gastrointestinal: Positive for nausea. Negative for abdominal pain, constipation, diarrhea and vomiting.  Genitourinary: Positive for vaginal bleeding. Negative for dysuria, frequency, urgency and vaginal discharge.  Musculoskeletal: Positive for back pain.   Physical Exam   Blood pressure 115/79, pulse 95, temperature 98.1 F (36.7 C), temperature source Oral, resp. rate 16, height 5\' 5"  (1.651 m), weight 67.2 kg, last menstrual period 07/07/2019, SpO2 99 %, unknown if currently breastfeeding.  Physical Exam  Nursing note and vitals reviewed. Constitutional: She is oriented to  person, place, and time. She appears well-developed and well-nourished. No distress.  HENT:  Head: Normocephalic and atraumatic.  Cardiovascular: Normal rate.  Respiratory: Effort normal.  GI: Soft. She exhibits no distension and no mass. There is no abdominal tenderness. There is no rebound and no guarding.  Genitourinary: Uterus is enlarged (slightly). Uterus is not tender. Cervix exhibits no motion tenderness, no discharge and no friability. Right adnexum displays no mass and no tenderness. Left adnexum displays no mass and no tenderness.    Vaginal bleeding (small) present.     No vaginal discharge.  There is bleeding (small) in the vagina.  Neurological: She is alert and oriented to person, place, and time.  Skin: Skin is warm and dry. No erythema.  Psychiatric: She has a normal mood and affect.  Cervix: closed, thick, firm,  posterior  Results for orders placed or performed during the hospital encounter of 08/28/19 (from the past 24 hour(s))  Pregnancy, urine POC     Status: Abnormal   Collection Time: 08/28/19 10:29 AM  Result Value Ref Range   Preg Test, Ur POSITIVE (A) NEGATIVE  CBC     Status: None   Collection Time: 08/28/19 11:00 AM  Result Value Ref Range   WBC 4.0 4.0 - 10.5 K/uL   RBC 4.36 3.87 - 5.11 MIL/uL   Hemoglobin 12.3 12.0 - 15.0 g/dL   HCT 36.2 36.0 - 46.0 %   MCV 83.0 80.0 - 100.0 fL   MCH 28.2 26.0 - 34.0 pg   MCHC 34.0 30.0 - 36.0 g/dL   RDW 12.9 11.5 - 15.5 %   Platelets 219 150 - 400 K/uL   nRBC 0.0 0.0 - 0.2 %  hCG, quantitative, pregnancy     Status: Abnormal   Collection Time: 08/28/19 11:00 AM  Result Value Ref Range   hCG, Beta Chain, Quant, S 8,995 (H) <5 mIU/mL  Wet prep, genital     Status: Abnormal   Collection Time: 08/28/19 11:25 AM   Specimen: Vaginal  Result Value Ref Range   Yeast Wet Prep HPF POC NONE SEEN NONE SEEN   Trich, Wet Prep NONE SEEN NONE SEEN   Clue Cells Wet Prep HPF POC PRESENT (A) NONE SEEN   WBC, Wet Prep HPF POC MANY (A) NONE SEEN   Sperm NONE SEEN     US OB LESS THAN 14 WEEKS WITH OB TRANSVAGINAL  Result Date: 08/28/2019 CLINICAL DATA:  Vaginal bleeding EXAM: OBSTETRIC <14 WK [place] AND TRANSVAGINAL OB US TECHNIQUE: Both transabdominal and transvaginal ultrasound examinations were performed for complete evaluation of the gestation as well as the maternal uterus, adnexal regions, and pelvic cul-de-sac. Transvaginal technique was performed to assess early pregnancy. COMPARISON:  None. FINDINGS: Intrauterine gestational sac: Visualized Yolk sac:  Visualized Embryo:  Visualized Cardiac Activity: Visualized Heart Rate: 110 bpm CRL:  11 mm   7 w   1 d                  [place] EDC: April 14, 2020 Subchorionic hemorrhage:  None visualized. Maternal uterus/adnexae: Cervical os is closed. No extrauterine pelvic or adnexal masses are evident. No free pelvic fluid.  IMPRESSION: Single live intrauterine gestation with estimated gestational age of approximately 23 weeks. No subchorionic hemorrhage. Study otherwise unremarkable. Electronically Signed   By: Lowella Grip III M.D.   On: 08/28/2019 12:02     MAU Course  Procedures None  MDM +UPT UA, wet prep, GC/chlamydia, CBC, quant hCG, HIV, RPR and [place]  today to rule out ectopic pregnancy O+ from previous visit  + clue cells without symptoms, results may be skewed due to blood in sample. Will treat if patient becomes symptomatic.   Assessment and Plan  A: SIUP at [redacted]w[redacted]d Vaginal bleeding in pregnancy, first trimester   P: Discharge home Bleeding precautions discussed Patient advised to follow-up with [facility] as scheduled to start prenatal care  Patient may return to MAU as needed or if her condition were to change or worsen  Kerry Hough, PA-C 08/28/2019, 12:28 PM

## 2019-08-30 ENCOUNTER — Inpatient Hospital Stay (EMERGENCY_DEPARTMENT_HOSPITAL)
Admission: AD | Admit: 2019-08-30 | Discharge: 2019-08-31 | Disposition: A | Discharge status: Home / Self Care | Payer: Medicaid Other | Source: Home / Self Care | Attending: Obstetrics & Gynecology | Admitting: Obstetrics & Gynecology

## 2019-08-30 ENCOUNTER — Other Ambulatory Visit: Payer: Self-pay

## 2019-08-30 ENCOUNTER — Inpatient Hospital Stay (HOSPITAL_COMMUNITY): Payer: Medicaid Other

## 2019-08-30 ENCOUNTER — Encounter (HOSPITAL_COMMUNITY): Payer: Self-pay | Admitting: Obstetrics & Gynecology

## 2019-08-30 ENCOUNTER — Inpatient Hospital Stay (HOSPITAL_COMMUNITY)
Admission: AD | Admit: 2019-08-30 | Discharge: 2019-08-30 | Discharge status: Against Medical Advice | Payer: Medicaid Other | Attending: Obstetrics & Gynecology | Admitting: Obstetrics & Gynecology

## 2019-08-30 DIAGNOSIS — O209 Hemorrhage in early pregnancy, unspecified: Secondary | ICD-10-CM

## 2019-08-30 DIAGNOSIS — M545 Low back pain: Secondary | ICD-10-CM | POA: Insufficient documentation

## 2019-08-30 DIAGNOSIS — D573 Sickle-cell trait: Secondary | ICD-10-CM | POA: Insufficient documentation

## 2019-08-30 DIAGNOSIS — Z6790 Unspecified blood type, Rh positive: Secondary | ICD-10-CM

## 2019-08-30 DIAGNOSIS — O26891 Other specified pregnancy related conditions, first trimester: Secondary | ICD-10-CM | POA: Insufficient documentation

## 2019-08-30 DIAGNOSIS — O99011 Anemia complicating pregnancy, first trimester: Secondary | ICD-10-CM | POA: Insufficient documentation

## 2019-08-30 DIAGNOSIS — Z3A01 Less than 8 weeks gestation of pregnancy: Secondary | ICD-10-CM

## 2019-08-30 DIAGNOSIS — O200 Threatened abortion: Secondary | ICD-10-CM | POA: Diagnosis present

## 2019-08-30 DIAGNOSIS — O99611 Diseases of the digestive system complicating pregnancy, first trimester: Secondary | ICD-10-CM | POA: Insufficient documentation

## 2019-08-30 DIAGNOSIS — K429 Umbilical hernia without obstruction or gangrene: Secondary | ICD-10-CM | POA: Diagnosis not present

## 2019-08-30 DIAGNOSIS — Z369 Encounter for antenatal screening, unspecified: Secondary | ICD-10-CM | POA: Diagnosis not present

## 2019-08-30 DIAGNOSIS — Z87891 Personal history of nicotine dependence: Secondary | ICD-10-CM | POA: Diagnosis not present

## 2019-08-30 DIAGNOSIS — O039 Complete or unspecified spontaneous abortion without complication: Secondary | ICD-10-CM

## 2019-08-30 LAB — URINALYSIS, ROUTINE W REFLEX MICROSCOPIC
Bacteria, UA: NONE SEEN
Bilirubin Urine: NEGATIVE
Glucose, UA: NEGATIVE mg/dL
Ketones, ur: NEGATIVE mg/dL
Leukocytes,Ua: NEGATIVE
Nitrite: NEGATIVE
Protein, ur: NEGATIVE mg/dL
RBC / HPF: >50 RBC/hpf — ABNORMAL HIGH (ref 0–5)
Specific Gravity, Urine: 1.013 (ref 1.005–1.030)
pH: 8 (ref 5.0–8.0)

## 2019-08-30 LAB — CBC
HCT: 36.1 % (ref 36.0–46.0)
Hemoglobin: 12.1 g/dL (ref 12.0–15.0)
MCH: 28.5 pg (ref 26.0–34.0)
MCHC: 33.5 g/dL (ref 30.0–36.0)
MCV: 85.1 fL (ref 80.0–100.0)
Platelets: 236 10*3/uL (ref 150–400)
RBC: 4.24 MIL/uL (ref 3.87–5.11)
RDW: 13 % (ref 11.5–15.5)
WBC: 4.5 10*3/uL (ref 4.0–10.5)
nRBC: 0 % (ref 0.0–0.2)

## 2019-08-30 NOTE — MAU Provider Note (Signed)
History     CSN: [id number]  Arrival date and time: 08/30/19 2119   First Provider Initiated Contact with Patient 08/30/19 2340      Chief Complaint  Patient presents with  . Vaginal Bleeding   [name] is a 34 y.o. [id number] at [redacted]w[redacted]d who presents to MAU for vaginal bleeding which began Wednesday 08/27/2019. Pt reports the bleeding started out light and became heavier, like a menstrual period with clotting. Pt came to MAU on 08/28/2019 for vaginal bleeding.  Of note, pt also reports she believes that her hernia has returned and requests referral to GI for evaluation.  Passing blood clots? yes Blood soaking clothes? no Lightheaded/dizzy? no Significant pelvic pain or cramping? no Passed any tissue? no  Current pregnancy problems? pt has not yet been seen Blood Type? O Positive Allergies? NKDA Current medications? PNVs Current PNC & next appt? [facility], 09/03/2018  Pt denies vaginal discharge/odor/itching. Pt denies N/V, abdominal pain, constipation, diarrhea, or urinary problems. Pt denies fever, chills, fatigue, sweating or changes in appetite. Pt denies SOB or chest pain. Pt denies dizziness, HA, light-headedness, weakness.   OB History    Gravida  7   Para  3   Term  3   Preterm      AB  3   Living  3     SAB  1   TAB  1   Ectopic  1   Multiple  0   Live Births  3           Past Medical History:  Diagnosis Date  . Anemia    1st pregn  . Ectopic pregnancy    MTX  . Gestational diabetes    Gestational DM, diet controlled - last pregn  . Hypoglycemia   . Sickle cell trait [facility])     Past Surgical History:  Procedure Laterality Date  . CESAREAN SECTION  07/09/2004   FTP - chorio  . DILATION AND CURETTAGE OF UTERUS     2010  . HERNIA REPAIR     2006   . UMBILICAL HERNIA REPAIR      Family History  Problem Relation Age of Onset  . Hyperlipidemia Mother   . Diabetes Father   . Hepatitis C Father   . Sickle cell anemia Father      Social History   Tobacco Use  . Smoking status: Former Smoker    Quit date: 11/18/2009    Years since quitting: 9.7  . Smokeless tobacco: Never Used  Substance Use Topics  . Alcohol use: No  . Drug use: No    Allergies: No Known Allergies  Medications Prior to Admission  Medication Sig Dispense Refill Last Dose  . Prenatal Vit-Fe Fumarate-FA (PRENATAL MULTIVITAMIN) TABS tablet Take 1 tablet by mouth at bedtime.       Review of Systems  Constitutional: Negative for chills, diaphoresis, fatigue and fever.  Eyes: Negative for visual disturbance.  Respiratory: Negative for shortness of breath.   Cardiovascular: Negative for chest pain.  Gastrointestinal: Negative for abdominal pain, constipation, diarrhea, nausea and vomiting.  Genitourinary: Positive for vaginal bleeding. Negative for dysuria, flank pain, frequency, pelvic pain, urgency and vaginal discharge.  Neurological: Negative for dizziness, weakness, light-headedness and headaches.   Physical Exam   Blood pressure 101/66, pulse 86, temperature 98.2 F (36.8 C), temperature source Oral, resp. rate 16, last menstrual period 07/07/2019, SpO2 100 %, unknown if currently breastfeeding.  Patient Vitals for the past 24 hrs:  BP Temp  Temp src Pulse Resp SpO2  08/30/19 2352 101/66 98.2 F (36.8 C) Oral 86 16 100 %   Physical Exam  Constitutional: She is oriented to person, place, and time. She appears well-developed and well-nourished. No distress.  HENT:  Head: Normocephalic and atraumatic.  Respiratory: Effort normal.  GI: Soft. She exhibits no distension and no mass. There is no abdominal tenderness. There is no rebound and no guarding.  Palpable, painless, mobile umbilical hernia palpated.  Neurological: She is alert and oriented to person, place, and time.  Skin: Skin is warm and dry. She is not diaphoretic.  Psychiatric: She has a normal mood and affect. Her behavior is normal. Judgment and thought content normal.    Results for orders placed or performed during the hospital encounter of 08/30/19 (from the past 24 hour(s))  CBC     Status: None   Collection Time: 08/30/19 11:44 PM  Result Value Ref Range   WBC 4.5 4.0 - 10.5 K/uL   RBC 4.24 3.87 - 5.11 MIL/uL   Hemoglobin 12.1 12.0 - 15.0 g/dL   HCT 36.1 36.0 - 46.0 %   MCV 85.1 80.0 - 100.0 fL   MCH 28.5 26.0 - 34.0 pg   MCHC 33.5 30.0 - 36.0 g/dL   RDW 13.0 11.5 - 15.5 %   Platelets 236 150 - 400 K/uL   nRBC 0.0 0.0 - 0.2 %  Comprehensive metabolic panel     Status: None   Collection Time: 08/30/19 11:44 PM  Result Value Ref Range   Sodium 137 135 - 145 mmol/L   Potassium 3.7 3.5 - 5.1 mmol/L   Chloride 104 98 - 111 mmol/L   CO2 26 22 - 32 mmol/L   Glucose, Bld 83 70 - 99 mg/dL   BUN 11 6 - 20 mg/dL   Creatinine, Ser 0.79 0.44 - 1.00 mg/dL   Calcium 9.5 8.9 - 10.3 mg/dL   Total Protein 7.1 6.5 - 8.1 g/dL   Albumin 3.7 3.5 - 5.0 g/dL   AST 16 15 - 41 U/L   ALT 12 0 - 44 U/L   Alkaline Phosphatase 57 38 - 126 U/L   Total Bilirubin 0.7 0.3 - 1.2 mg/dL   GFR calc non Af Amer >60 >60 mL/min   GFR calc Af Amer >60 >60 mL/min   Anion gap 7 5 - 15  hCG, quantitative, pregnancy     Status: Abnormal   Collection Time: 08/30/19 11:44 PM  Result Value Ref Range   hCG, Beta Chain, Quant, S 708 (H) <5 mIU/mL   US OB Transvaginal  Result Date: 08/30/2019 CLINICAL DATA:  Bleeding EXAM: OBSTETRIC <14 WK [place] AND TRANSVAGINAL OB US TECHNIQUE: Both transabdominal and transvaginal ultrasound examinations were performed for complete evaluation of the gestation as well as the maternal uterus, adnexal regions, and pelvic cul-de-sac. Transvaginal technique was performed to assess early pregnancy. COMPARISON:  08/28/2019 FINDINGS: Intrauterine gestational sac: None Yolk sac:  Not Visualized. Embryo:  Not Visualized. Cardiac Activity: Not Visualized. Maternal uterus/adnexae: The right ovary is unremarkable. The left ovary is not visualized. IMPRESSION: No  gestational sac visualized on today's exam. Electronically Signed   By: Constance Holster M.D.   On: 08/30/2019 22:29   US OB LESS THAN 14 WEEKS WITH OB TRANSVAGINAL  Result Date: 08/28/2019 CLINICAL DATA:  Vaginal bleeding EXAM: OBSTETRIC <14 WK [place] AND TRANSVAGINAL OB US TECHNIQUE: Both transabdominal and transvaginal ultrasound examinations were performed for complete evaluation of the gestation as well as the  maternal uterus, adnexal regions, and pelvic cul-de-sac. Transvaginal technique was performed to assess early pregnancy. COMPARISON:  None. FINDINGS: Intrauterine gestational sac: Visualized Yolk sac:  Visualized Embryo:  Visualized Cardiac Activity: Visualized Heart Rate: 110 bpm CRL:  11 mm   7 w   1 d                  [place] EDC: April 14, 2020 Subchorionic hemorrhage:  None visualized. Maternal uterus/adnexae: Cervical os is closed. No extrauterine pelvic or adnexal masses are evident. No free pelvic fluid. IMPRESSION: Single live intrauterine gestation with estimated gestational age of approximately 46 weeks. No subchorionic hemorrhage. Study otherwise unremarkable. Electronically Signed   By: Lowella Grip III M.D.   On: 08/28/2019 12:02   MAU Course  Procedures  MDM -VB with previously seen IUP -UA: straw/mod hgb, otherwise WNL -CBC: WNL -CMP: WNL -[place]: no IUP, on [place] 08/28/2019, single IUP, FHR 110, [redacted]w[redacted]d without subchorionic hemorrhage -hCG: 708 (was 8,995 3days ago) -ABO: O Positive -WetPrep: performed 08/28/2019, normal -GC/CT collected 08/28/2019, results pending -pt discharged to home in stable condition  Orders Placed This Encounter  Procedures  . CBC    Standing Status:   Standing    Number of Occurrences:   1  . Comprehensive metabolic panel    Standing Status:   Standing    Number of Occurrences:   1  . hCG, quantitative, pregnancy    Standing Status:   Standing    Number of Occurrences:   1  . Ambulatory referral to Gastroenterology    Referral Priority:    Routine    Referral Type:   Consultation    Referral Reason:   Specialty Services Required    Number of Visits Requested:   1  . Discharge patient    Order Specific Question:   Discharge disposition    Answer:   01-Home or Self Care [1]    Order Specific Question:   Discharge patient date    Answer:   08/31/2019   No orders of the defined types were placed in this encounter.  Assessment and Plan   1. Umbilical hernia without obstruction and without gangrene   2. Miscarriage   3. Blood type, Rh positive    Allergies as of 08/31/2019   No Known Allergies     Medication List    TAKE these medications   prenatal multivitamin Tabs tablet Take 1 tablet by mouth at bedtime.      -referral for GI for return of hernia -message sent to [facility] to cancel NOB and schedule hCG in one week and hCG/provider visit in two weeks -discussed s/sx of retained POCs, pt encouraged to keep f/u visits as advised -strict bleeding/pain/return MAU precautions given -pt discharged to home in stable condition  Elmyra Ricks E Amily Depp 08/31/2019, 12:42 AM

## 2019-08-30 NOTE — MAU Note (Signed)
Amanda [name] is a 34 y.o. at [redacted]w[redacted]d here in MAU reporting: states she is still bleeding since last MAU visit and it is getting heavier. Is now wearing a pad. States she is wearing a large pad and is changing it every 6 hours. Around 1700 saw 1 quarter size clot. Having some intermittent lower back pain, states it is the same as previous visit.  Onset of complaint: progressively getting heavier  Pain score: 2/10  Vitals:   08/30/19 1849  BP: 101/74  Pulse: 88  Resp: 17  Temp: 98.6 F (37 C)  SpO2: 100%     Lab orders placed from triage: UA

## 2019-08-30 NOTE — MAU Note (Signed)
Patient's 3 children ages 51, 68, and 33 years old were in lobby. Security and house coverage at bedside to speak with patient about having someone pick up children d/t not allowing children in hospital because of Covid. Patient says "husband is at work and they only have 1 car." Patient states there is no one else to watch children. Patient refused to call husband at work, house coverage and security discussed with patient about having someone pick up children vs calling [employer]. Patient asking to be discharged. Given that patient needs further workup, provider discussed staying for further evaluation vs signing AMA. Patient wants to leave AMA.  Patient refused to sign AMA paper. Patient walked out of unit ambulatory without assistance. Altamease Oiler, NP aware.

## 2019-08-30 NOTE — MAU Provider Note (Signed)
History     CSN: [id number]  Arrival date and time: 08/30/19 1825   First Provider Initiated Contact with Patient 08/30/19 1944      Chief Complaint  Patient presents with  . Back Pain  . Vaginal Bleeding   HPI   Amanda [name] is a 34 y.o. female [phone] @ [redacted]w[redacted]d here in MAU with complaints of vaginal bleeding. The bleeding started a few days ago and she was seen for this in MAU. She states the bleeding is heavier today than it was when she was last seen. She denies pain at this time. Occasionally she feels some lower back pain. States the bleeding is similar to a period and she is wearing a pad at this time. She denies dizziness.   OB History    Gravida  7   Para  3   Term  3   Preterm      AB  3   Living  3     SAB  1   TAB  1   Ectopic  1   Multiple  0   Live Births  3           Past Medical History:  Diagnosis Date  . Anemia    1st pregn  . Ectopic pregnancy    MTX  . Gestational diabetes    Gestational DM, diet controlled - last pregn  . Hypoglycemia   . Sickle cell trait [facility])     Past Surgical History:  Procedure Laterality Date  . CESAREAN SECTION  07/09/2004   FTP - chorio  . DILATION AND CURETTAGE OF UTERUS     2010  . HERNIA REPAIR     2006   . UMBILICAL HERNIA REPAIR      Family History  Problem Relation Age of Onset  . Hyperlipidemia Mother   . Diabetes Father   . Hepatitis C Father   . Sickle cell anemia Father     Social History   Tobacco Use  . Smoking status: Former Smoker    Quit date: 11/18/2009    Years since quitting: 9.7  . Smokeless tobacco: Never Used  Substance Use Topics  . Alcohol use: No  . Drug use: No    Allergies: No Known Allergies  Medications Prior to Admission  Medication Sig Dispense Refill Last Dose  . Prenatal Vit-Fe Fumarate-FA (PRENATAL MULTIVITAMIN) TABS tablet Take 1 tablet by mouth at bedtime.   08/29/2019 at Unknown time   Results for orders placed or performed during the hospital  encounter of 08/30/19 (from the past 48 hour(s))  Urinalysis, Routine w reflex microscopic     Status: Abnormal   Collection Time: 08/30/19  6:46 PM  Result Value Ref Range   Color, Urine STRAW (A) YELLOW   APPearance CLEAR CLEAR   Specific Gravity, Urine 1.013 1.005 - 1.030   pH 8.0 5.0 - 8.0   Glucose, UA NEGATIVE NEGATIVE mg/dL   Hgb urine dipstick MODERATE (A) NEGATIVE   Bilirubin Urine NEGATIVE NEGATIVE   Ketones, ur NEGATIVE NEGATIVE mg/dL   Protein, ur NEGATIVE NEGATIVE mg/dL   Nitrite NEGATIVE NEGATIVE   Leukocytes,Ua NEGATIVE NEGATIVE   RBC / HPF >50 (H) 0 - 5 RBC/hpf   WBC, UA 0-5 0 - 5 WBC/hpf   Bacteria, UA NONE SEEN NONE SEEN   Squamous Epithelial / LPF 0-5 0 - 5    Comment: Performed at [facility], [place] [address] [place], [place] [address]   Review of  Systems  Constitutional: Negative for fever.  Gastrointestinal: Negative for abdominal pain.  Genitourinary: Positive for vaginal bleeding.   Physical Exam   Blood pressure 101/74, pulse 88, temperature 98.6 F (37 C), temperature source Oral, resp. rate 17, height 5\' 5"  (1.651 m), weight 67.8 kg, last menstrual period 07/07/2019, SpO2 100 %, unknown if currently breastfeeding.  Physical Exam  Constitutional: She is oriented to person, place, and time. She appears well-developed and well-nourished. No distress.  GI: Soft. She exhibits no distension. There is no abdominal tenderness. There is no rebound and no guarding.  Genitourinary:    Genitourinary Comments: Quarter size of bright red blood noted on patient's pad.    Musculoskeletal:        General: Normal range of motion.  Neurological: She is alert and oriented to person, place, and time.  Skin: Skin is warm. She is not diaphoretic.  Psychiatric: Her behavior is normal.   MAU Course  Procedures   Pt informed that the ultrasound is considered a limited OB ultrasound and is not intended to be a complete ultrasound exam.  Patient also informed that  the ultrasound is not being completed with the intent of assessing for fetal or placental anomalies or any pelvic abnormalities.  Explained that the purpose of today's ultrasound is to assess for  viability.  Patient acknowledges the purpose of the exam and the limitations of the study.      MDM  Bedside US shows fetus with absent fetal heart-rate. Official US ordered.  House coverage called d/t patient arriving to [place] with 3 children under the age of 73 and waiting in the lobby without an adult. The patient was asked to have an adult stay with the children and she states that was not an option. She was given the choice to have an adult come supervise or security here would have to call the police. The patient walked out of MAU, she refused to sign AMA papers.    Assessment and Plan   A:  Vaginal bleeding in pregnancy, concerning for SAB Threatened miscarriage    P:  Patient walked out of MAU She was encouraged to return for workup O positive blood type.   Noni Saupe I, NP 08/31/2019 8:25 AM

## 2019-08-30 NOTE — MAU Note (Signed)
No Covid test needed per [facility].

## 2019-08-30 NOTE — MAU Note (Signed)
Pt returned to MAU after leaving AMA due to not having someone to watch her children.  Pt denies any changes since leaving MAU.

## 2019-08-31 LAB — COMPREHENSIVE METABOLIC PANEL
ALT: 12 U/L (ref 0–44)
AST: 16 U/L (ref 15–41)
Albumin: 3.7 g/dL (ref 3.5–5.0)
Alkaline Phosphatase: 57 U/L (ref 38–126)
Anion gap: 7 (ref 5–15)
BUN: 11 mg/dL (ref 6–20)
CO2: 26 mmol/L (ref 22–32)
Calcium: 9.5 mg/dL (ref 8.9–10.3)
Chloride: 104 mmol/L (ref 98–111)
Creatinine, Ser: 0.79 mg/dL (ref 0.44–1.00)
GFR calc Af Amer: >60 mL/min (ref >60)
GFR calc non Af Amer: >60 mL/min (ref >60)
Glucose, Bld: 83 mg/dL (ref 70–99)
Potassium: 3.7 mmol/L (ref 3.5–5.1)
Sodium: 137 mmol/L (ref 135–145)
Total Bilirubin: 0.7 mg/dL (ref 0.3–1.2)
Total Protein: 7.1 g/dL (ref 6.5–8.1)

## 2019-08-31 LAB — HCG, QUANTITATIVE, PREGNANCY: hCG, Beta Chain, Quant, S: 708 m[IU]/mL — ABNORMAL HIGH (ref <5)

## 2019-08-31 NOTE — Discharge Instructions (Signed)
Miscarriage A miscarriage is the loss of an unborn baby (fetus) before the 20th week of pregnancy. Most miscarriages happen during the first 3 months of pregnancy. Sometimes, a miscarriage can happen before a woman knows that she is pregnant. Having a miscarriage can be an emotional experience. If you have had a miscarriage, talk with your health care provider about any questions you may have about miscarrying, the grieving process, and your plans for future pregnancy. What are the causes? A miscarriage may be caused by:  Problems with the genes or chromosomes of the fetus. These problems make it impossible for the baby to develop normally. They are often the result of random errors that occur early in the development of the baby, and are not passed from parent to child (not inherited).  Infection of the cervix or uterus.  Conditions that affect hormone balance in the body.  Problems with the cervix, such as the cervix opening and thinning before pregnancy is at term (cervical insufficiency).  Problems with the uterus. These may include: ? A uterus with an abnormal shape. ? Fibroids in the uterus. ? Congenital abnormalities. These are problems that were present at birth.  Certain medical conditions.  Smoking, drinking alcohol, or using drugs.  Injury (trauma). In many cases, the cause of a miscarriage is not known. What are the signs or symptoms? Symptoms of this condition include:  Vaginal bleeding or spotting, with or without cramps or pain.  Pain or cramping in the abdomen or lower back.  Passing fluid, tissue, or blood clots from the vagina. How is this diagnosed? This condition may be diagnosed based on:  A physical exam.  Ultrasound.  Blood tests.  Urine tests. How is this treated? Treatment for a miscarriage is sometimes not necessary if you naturally pass all the tissue that was in your uterus. If necessary, this condition may be treated with:  Dilation and  curettage (D&C). This is a procedure in which the cervix is stretched open and the lining of the uterus (endometrium) is scraped. This is done only if tissue from the fetus or placenta remains in the body (incomplete miscarriage).  Medicines, such as: ? Antibiotic medicine, to treat infection. ? Medicine to help the body pass any remaining tissue. ? Medicine to reduce (contract) the size of the uterus. These medicines may be given if you have a lot of bleeding. If you have Rh negative blood and your baby was Rh positive, you will need a shot of a medicine called Rh immunoglobulinto protect your future babies from Rh blood problems. "Rh-negative" and "Rh-positive" refer to whether or not the blood has a specific protein found on the surface of red blood cells (Rh factor). Follow these instructions at home: Medicines   Take over-the-counter and prescription medicines only as told by your health care provider.  If you were prescribed antibiotic medicine, take it as told by your health care provider. Do not stop taking the antibiotic even if you start to feel better.  Do not take NSAIDs, such as aspirin and ibuprofen, unless they are approved by your health care provider. These medicines can cause bleeding. Activity  Rest as directed. Ask your health care provider what activities are safe for you.  Have someone help with home and family responsibilities during this time. General instructions  Keep track of the number of sanitary pads you use each day and how soaked (saturated) they are. Write down this information.  Monitor the amount of tissue or blood clots that   you pass from your vagina. Save any large amounts of tissue for your health care provider to examine.  Do not use tampons, douche, or have sex until your health care provider approves.  To help you and your partner with the process of grieving, talk with your health care provider or seek counseling.  When you are ready, meet with  your health care provider to discuss any important steps you should take for your health. Also, discuss steps you should take to have a healthy pregnancy in the future.  Keep all follow-up visits as told by your health care provider. This is important. Where to find more information  The American Congress of Obstetricians and Gynecologists: www.acog.org  [place] Department of Health and Human Services Office of Women's Health: www.womenshealth.gov Contact a health care provider if:  You have a fever or chills.  You have a foul smelling vaginal discharge.  You have more bleeding instead of less. Get help right away if:  You have severe cramps or pain in your back or abdomen.  You pass blood clots or tissue from your vagina that is walnut-sized or larger.  You soak more than 1 regular sanitary pad in an hour.  You become light-headed or weak.  You pass out.  You have feelings of sadness that take over your thoughts, or you have thoughts of hurting yourself. Summary  Most miscarriages happen in the first 3 months of pregnancy. Sometimes miscarriage happens before a woman even knows that she is pregnant.  Follow your health care provider's instruction for home care. Keep all follow-up appointments.  To help you and your partner with the process of grieving, talk with your health care provider or seek counseling. This information is not intended to replace advice given to you by your health care provider. Make sure you discuss any questions you have with your health care provider. Document Revised: 12/06/2018 Document Reviewed: 09/19/2016 Elsevier Patient Education  2020 Elsevier Inc.   Managing Pregnancy Loss Pregnancy loss can happen any time during a pregnancy. Often the cause is not known. It is rarely because of anything you did. Pregnancy loss in early pregnancy (during the first trimester) is called a miscarriage. This type of pregnancy loss is the most common. Pregnancy loss  that happens after 20 weeks of pregnancy is called fetal demise if the baby's heart stops beating before birth. Fetal demise is much less common. Some women experience spontaneous labor shortly after fetal demise resulting in a stillborn birth (stillbirth). Any pregnancy loss can be devastating. You will need to recover both physically and emotionally. Most women are able to get pregnant again after a pregnancy loss and deliver a healthy baby. How to manage emotional recovery  Pregnancy loss is very hard emotionally. You may feel many different emotions while you grieve. You may feel sad and angry. You may also feel guilty. It is normal to have periods of crying. Emotional recovery can take longer than physical recovery. It is different for everyone. Taking these steps can help you in managing this loss:  Remember that it is unlikely you did anything to cause the pregnancy loss.  Share your thoughts and feelings with friends, family, and your partner. Remember that your partner is also recovering emotionally.  Make sure you have a good support system. Do not spend too much time alone.  Meet with a pregnancy loss counselor or join a pregnancy loss support group.  Get enough sleep and eat a healthy diet. Return to regular exercise   when you have recovered physically.  Do not use drugs or alcohol to manage your emotions.  Consider seeing a mental health professional to help you recover emotionally.  Ask a friend or loved one to help you decide what to do with any clothing and nursery items you received for your baby. In the case of a stillbirth, many women benefit from taking additional steps in the grieving process. You may want to:  Hold your baby after the birth.  Name your baby.  Request a birth certificate.  Create a keepsake such as handprints or footprints.  Dress your baby and have a picture taken.  Make funeral arrangements.  Ask for a baptism or blessing. Hospitals have  staff members who can help you with all these arrangements. How to recognize emotional stress It is normal to have emotional stress after a pregnancy loss. But emotional stress that lasts a long time or becomes severe requires treatment. Watch out for these signs of severe emotional stress:  Sadness, anger, or guilt that is not going away and is interfering with your normal activities.  Relationship problems that have occurred or gotten worse since the pregnancy loss.  Signs of depression that last longer than 2 weeks. These may include: ? Sadness. ? Anxiety. ? Hopelessness. ? Loss of interest in activities you enjoy. ? Inability to concentrate. ? Trouble sleeping or sleeping too much. ? Loss of appetite or overeating. ? Thoughts of death or of hurting yourself. Follow these instructions at home:  Take over-the-counter and prescription medicines only as told by your health care provider.  Rest at home until your energy level returns. Return to your normal activities as told by your health care provider. Ask your health care provider what activities are safe for you.  When you are ready, meet with your health care provider to discuss steps to take for a future pregnancy.  Keep all follow-up visits as told by your health care provider. This is important. Where to find support  To help you and your partner with the process of grieving, talk with your health care provider or seek counseling.  Consider meeting with others who have experienced pregnancy loss. Ask your health care provider about support groups and resources. Where to find more information  [place].S. Department of Health and Human Services Office on Women's Health: www.womenshealth.gov  American Pregnancy Association: www.americanpregnancy.org Contact a health care provider if:  You continue to experience grief, sadness, or lack of motivation for everyday activities, and those feelings do not improve over time.  You are  struggling to recover emotionally, especially if you are using alcohol or substances to help. Get help right away if:  You have thoughts of hurting yourself or others. If you ever feel like you may hurt yourself or others, or have thoughts about taking your own life, get help right away. You can go to your nearest emergency department or call:  Your local emergency services (911 in the [place]  A suicide crisis helpline, such as the National Suicide Prevention Lifeline at 1-800-273-8255. This is open 24 hours a day. Summary  Any pregnancy loss can be difficult physically and emotionally.  You may experience many different emotions while you grieve. Emotional recovery can last longer than physical recovery.  It is normal to have emotional stress after a pregnancy loss. But emotional stress that lasts a long time or becomes severe requires treatment.  See your health care provider if you are struggling emotionally after a pregnancy loss. This information   is not intended to replace advice given to you by your health care provider. Make sure you discuss any questions you have with your health care provider. Document Revised: 12/04/2018 Document Reviewed: 10/25/2017 Elsevier Patient Education  2020 Elsevier Inc.  

## 2019-09-02 LAB — GC/CHLAMYDIA PROBE AMP (~~LOC~~) NOT AT ARMC
Chlamydia: NEGATIVE
Comment: NEGATIVE
Comment: NORMAL
Neisseria Gonorrhea: NEGATIVE

## 2019-09-04 ENCOUNTER — Ambulatory Visit: Payer: Medicaid Other

## 2019-09-05 ENCOUNTER — Other Ambulatory Visit: Payer: Self-pay | Admitting: *Deleted

## 2019-09-05 DIAGNOSIS — O039 Complete or unspecified spontaneous abortion without complication: Secondary | ICD-10-CM

## 2019-09-08 ENCOUNTER — Other Ambulatory Visit (INDEPENDENT_AMBULATORY_CARE_PROVIDER_SITE_OTHER): Payer: Medicaid Other

## 2019-09-08 ENCOUNTER — Ambulatory Visit (INDEPENDENT_AMBULATORY_CARE_PROVIDER_SITE_OTHER): Payer: Medicaid Other | Admitting: *Deleted

## 2019-09-08 ENCOUNTER — Other Ambulatory Visit: Payer: Self-pay

## 2019-09-08 DIAGNOSIS — Z111 Encounter for screening for respiratory tuberculosis: Secondary | ICD-10-CM

## 2019-09-08 DIAGNOSIS — O039 Complete or unspecified spontaneous abortion without complication: Secondary | ICD-10-CM

## 2019-09-08 DIAGNOSIS — Z3490 Encounter for supervision of normal pregnancy, unspecified, unspecified trimester: Secondary | ICD-10-CM

## 2019-09-08 MED ORDER — TUBERCULIN PPD 5 UNIT/0.1ML ID SOLN
5.0000 [IU] | Freq: Once | INTRADERMAL | Status: AC
  Administered 2019-09-08: 5 [IU] via INTRADERMAL

## 2019-09-08 MED ORDER — TUBERCULIN PPD 5 UNIT/0.1ML ID SOLN: 0.1000 mL | Freq: Once | INTRADERMAL | 0 refills | Status: DC

## 2019-09-08 NOTE — Progress Notes (Signed)
Pt here for nonstat BHCG. Requested TB skin test. TB test place on R inner forearm and pt tol well. Will return in 48-72hrs to have test read. Pt voices understanding

## 2019-09-08 NOTE — Addendum Note (Signed)
Addended by: Kandyce Rud on: 09/08/2019 02:03 PM   Modules accepted: Orders

## 2019-09-08 NOTE — Progress Notes (Signed)
Pt here for BHCG. 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 0.81ml Tuberculin placed R arm and pt tol well. Understands to return 48-72hrs to have site checked. Pt voices understanding.

## 2019-09-08 NOTE — Progress Notes (Signed)
Chart reviewed for nurse visit. Agree with plan of care.   Jorje Guild, NP 09/08/2019 3:04 PM

## 2019-09-08 NOTE — Progress Notes (Signed)
Chart reviewed for nurse visit. Agree with plan of care.   Jorje Guild, NP 09/08/2019 4:30 PM

## 2019-09-09 LAB — BETA HCG QUANT (REF LAB): hCG Quant: 19 m[IU]/mL

## 2019-09-10 ENCOUNTER — Other Ambulatory Visit: Payer: Self-pay

## 2019-09-10 ENCOUNTER — Ambulatory Visit (INDEPENDENT_AMBULATORY_CARE_PROVIDER_SITE_OTHER): Payer: Medicaid Other

## 2019-09-10 DIAGNOSIS — Z111 Encounter for screening for respiratory tuberculosis: Secondary | ICD-10-CM

## 2019-09-10 NOTE — Progress Notes (Signed)
Pt here today for PPD reading following test on 09/08/19; 5u Tubersol given in right forearm by Blima Singer, RN at that time. No redness, itching, or raised area to the injection site today. PPD skin test is negative. Letter given to pt with PPD results.   Apolonio Schneiders RN 09/10/19

## 2019-09-12 NOTE — Progress Notes (Signed)
Chart reviewed for nurse visit. Agree with plan of care.   Noni Saupe I, NP 09/12/2019 10:22 AM

## 2019-09-16 ENCOUNTER — Encounter: Payer: Self-pay | Admitting: Advanced Practice Midwife

## 2019-09-16 ENCOUNTER — Other Ambulatory Visit (HOSPITAL_COMMUNITY)
Admission: RE | Admit: 2019-09-16 | Discharge: 2019-09-16 | Disposition: A | Discharge status: Home / Self Care | Payer: Medicaid Other | Source: Ambulatory Visit | Attending: Advanced Practice Midwife | Admitting: Advanced Practice Midwife

## 2019-09-16 ENCOUNTER — Other Ambulatory Visit: Payer: Self-pay

## 2019-09-16 ENCOUNTER — Ambulatory Visit (INDEPENDENT_AMBULATORY_CARE_PROVIDER_SITE_OTHER): Payer: Medicaid Other | Admitting: Advanced Practice Midwife

## 2019-09-16 VITALS — BP 129/85 | HR 96 | Wt 153.2 lb

## 2019-09-16 DIAGNOSIS — Z0000 Encounter for general adult medical examination without abnormal findings: Secondary | ICD-10-CM | POA: Diagnosis not present

## 2019-09-16 DIAGNOSIS — Z01419 Encounter for gynecological examination (general) (routine) without abnormal findings: Secondary | ICD-10-CM

## 2019-09-16 DIAGNOSIS — K429 Umbilical hernia without obstruction or gangrene: Secondary | ICD-10-CM

## 2019-09-16 DIAGNOSIS — O039 Complete or unspecified spontaneous abortion without complication: Secondary | ICD-10-CM

## 2019-09-16 NOTE — Progress Notes (Signed)
GYNECOLOGY ANNUAL PREVENTATIVE CARE ENCOUNTER NOTE  Subjective:   Amanda [name] is a 34 y.o. [phone] female here for a routine annual gynecologic exam.  Current complaints: none.   Denies abnormal vaginal bleeding, discharge, pelvic pain, problems with intercourse or other gynecologic concerns.    Gynecologic History Patient's last menstrual period was 07/07/2019. Contraception: none planning a pregnancy  Last Pap: 08/2015. Results were: normal Last mammogram: NA, age. Results were: NA age   Obstetric History OB History  Gravida Para Term Preterm AB Living  7 3 3   3 3   SAB TAB Ectopic Multiple Live Births  1 1 1  0 3    # Outcome Date GA Lbr Len/2nd Weight Sex Delivery Anes PTL Lv  7 Current           6 Term 07/02/18 [redacted]w[redacted]d 00:08 / 00:03 7 lb 10 oz (3.459 kg) F VBAC None  LIV  5 Term 03/28/16 [redacted]w[redacted]d 02:45 / 00:25 7 lb 12.3 oz (3.525 kg) M VBAC None  LIV  4 Ectopic 2015          3 SAB 2010 [redacted]w[redacted]d            Birth Comments: had D&c  2 TAB 2007          1 Term 2005 [redacted]w[redacted]d  7 lb 8 oz (3.402 kg) F CS-LTranv   LIV     Birth Comments: C/S because pelvis small, and unsure how long PROM    Past Medical History:  Diagnosis Date  . Anemia    1st pregn  . Ectopic pregnancy    MTX  . Gestational diabetes    Gestational DM, diet controlled - last pregn  . Hypoglycemia   . Sickle cell trait [facility])     Past Surgical History:  Procedure Laterality Date  . CESAREAN SECTION  07/09/2004   FTP - chorio  . DILATION AND CURETTAGE OF UTERUS     2010  . HERNIA REPAIR     2006   . UMBILICAL HERNIA REPAIR      Current Outpatient Medications on File Prior to Visit  Medication Sig Dispense Refill  . Prenatal Vit-Fe Fumarate-FA (PRENATAL MULTIVITAMIN) TABS tablet Take 1 tablet by mouth at bedtime.     No current facility-administered medications on file prior to visit.    No Known Allergies  Social History   Socioeconomic History  . Marital status: Married    Spouse name: Not on file  .  Number of children: Not on file  . Years of education: Not on file  . Highest education level: Not on file  Occupational History  . Not on file  Tobacco Use  . Smoking status: Former Smoker    Quit date: 11/18/2009    Years since quitting: 9.8  . Smokeless tobacco: Never Used  Substance and Sexual Activity  . Alcohol use: No  . Drug use: No  . Sexual activity: Yes    Birth control/protection: None  Other Topics Concern  . Not on file  Social History Narrative  . Not on file   Social Determinants of Health   Financial Resource Strain:   . Difficulty of Paying Living Expenses: Not on file  Food Insecurity:   . Worried About Charity fundraiser in the Last Year: Not on file  . Ran Out of Food in the Last Year: Not on file  Transportation Needs:   . Lack of Transportation (Medical): Not on file  . Lack of Transportation (Non-Medical):  Not on file  Physical Activity:   . Days of Exercise per Week: Not on file  . Minutes of Exercise per Session: Not on file  Stress:   . Feeling of Stress : Not on file  Social Connections:   . Frequency of Communication with Friends and Family: Not on file  . Frequency of Social Gatherings with Friends and Family: Not on file  . Attends Religious Services: Not on file  . Active Member of Clubs or Organizations: Not on file  . Attends Archivist Meetings: Not on file  . Marital Status: Not on file  Intimate Partner Violence:   . Fear of Current or Ex-Partner: Not on file  . Emotionally Abused: Not on file  . Physically Abused: Not on file  . Sexually Abused: Not on file    Family History  Problem Relation Age of Onset  . Hyperlipidemia Mother   . Diabetes Father   . Hepatitis C Father   . Sickle cell anemia Father     The following portions of the patient's history were reviewed and updated as appropriate: allergies, current medications, past family history, past medical history, past social history, past surgical history and  problem list.  Review of Systems Pertinent items noted in HPI and remainder of comprehensive ROS otherwise negative.   Objective:  BP 129/85   Pulse 96   Wt 153 lb 3.2 oz (69.5 kg)   LMP 07/07/2019   BMI 25.49 kg/m  CONSTITUTIONAL: Well-developed, well-nourished female in no acute distress.  HENT:  Normocephalic, atraumatic, External right and left ear normal. Oropharynx is clear and moist EYES: Conjunctivae and EOM are normal. Pupils are equal, round, and reactive to light. No scleral icterus.  NECK: Normal range of motion, supple, no masses.  Normal thyroid.  SKIN: Skin is warm and dry. No rash noted. Not diaphoretic. No erythema. No pallor. NEUROLOGIC: Alert and oriented to person, place, and time. Normal reflexes, muscle tone coordination. No cranial nerve deficit noted. PSYCHIATRIC: Normal mood and affect. Normal behavior. Normal judgment and thought content. CARDIOVASCULAR: Normal heart rate noted, regular rhythm RESPIRATORY: Clear to auscultation bilaterally. Effort and breath sounds normal, no problems with respiration noted. BREASTS: Symmetric in size. No masses, skin changes, nipple drainage, or lymphadenopathy. ABDOMEN: Soft, normal bowel sounds, no distention noted.  No tenderness, rebound or guarding.  PELVIC: Normal appearing external genitalia; normal appearing vaginal mucosa and cervix.  No abnormal discharge noted.  Pap smear obtained.  Normal uterine size, no other palpable masses, no uterine or adnexal tenderness. MUSCULOSKELETAL: Normal range of motion. No tenderness.  No cyanosis, clubbing, or edema.  2+ distal pulses.   Assessment and Plan:  1. Women's annual routine gynecological examination - Cytology - PAP( [place]) - Amb referral to general surgery for umbilical hernia  2. SAB (spontaneous abortion) - HCG less than 25 on 09/08/19  Will follow up results of pap smear and manage accordingly. Routine preventative health maintenance measures  emphasized. Please refer to After Visit Summary for other counseling recommendations.   Marcille Buffy DNP, CNM  09/16/19  3:15 PM

## 2019-09-17 LAB — CYTOLOGY - PAP
Comment: NEGATIVE
Diagnosis: NEGATIVE
High risk HPV: NEGATIVE

## 2019-09-29 ENCOUNTER — Ambulatory Visit: Payer: Medicaid Other | Admitting: Advanced Practice Midwife

## 2020-11-28 ENCOUNTER — Encounter (HOSPITAL_COMMUNITY): Payer: Self-pay | Admitting: Emergency Medicine

## 2020-11-28 ENCOUNTER — Other Ambulatory Visit: Payer: Self-pay

## 2020-11-28 ENCOUNTER — Emergency Department (HOSPITAL_COMMUNITY): Payer: Medicaid Other

## 2020-11-28 ENCOUNTER — Emergency Department (HOSPITAL_COMMUNITY)
Admission: EM | Admit: 2020-11-28 | Discharge: 2020-11-28 | Disposition: A | Discharge status: Home / Self Care | Payer: Medicaid Other | Attending: Emergency Medicine | Admitting: Emergency Medicine

## 2020-11-28 DIAGNOSIS — W108XXA Fall (on) (from) other stairs and steps, initial encounter: Secondary | ICD-10-CM | POA: Diagnosis not present

## 2020-11-28 DIAGNOSIS — Z87891 Personal history of nicotine dependence: Secondary | ICD-10-CM | POA: Insufficient documentation

## 2020-11-28 DIAGNOSIS — M79641 Pain in right hand: Secondary | ICD-10-CM | POA: Diagnosis not present

## 2020-11-28 NOTE — ED Triage Notes (Signed)
Patient reports fall x1 week ago with right hand injury. Reports another injury today and continued pain today.

## 2020-11-28 NOTE — ED Provider Notes (Signed)
[facility] DEPT Provider Note   CSN: [id number] Arrival date & time: 11/28/20  1354     History Chief Complaint  Patient presents with  . Hand Pain    Amanda [name] is a 35 y.o. female presenting to the  emergency department with right hand pain.  She is right-handed.  She says she fell on the stairs a few days ago, and felt a step up between her second and third finger.  She was having pain in her palmar region.  Today she accidentally slapped her hand again against a hard surface, and had worsening of her pain.  She denies pain in the wrist.  She denies numbness or weakness.  HPI     Past Medical History:  Diagnosis Date  . Anemia    1st pregn  . Ectopic pregnancy    MTX  . Gestational diabetes    Gestational DM, diet controlled - last pregn  . Hypoglycemia   . Sickle cell trait [facility])     Patient Active Problem List   Diagnosis Date Noted  . Sciatic leg pain 05/29/2018  . History of cesarean delivery 12/19/2017  . History of gestational diabetes mellitus (GDM) 03/06/2016  . Anxiety state 11/18/2015  . Depression 11/18/2015  . Sickle Cell Trait  Hemoglobin A-S genotype ([place]) 09/26/2015  . HSV (herpes simplex virus) anogenital infection 07/12/2014    Past Surgical History:  Procedure Laterality Date  . CESAREAN SECTION  07/09/2004   FTP - chorio  . DILATION AND CURETTAGE OF UTERUS     2010  . HERNIA REPAIR     2006   . UMBILICAL HERNIA REPAIR       OB History    Gravida  7   Para  3   Term  3   Preterm      AB  3   Living  3     SAB  1   IAB  1   Ectopic  1   Multiple  0   Live Births  3           Family History  Problem Relation Age of Onset  . Hyperlipidemia Mother   . Diabetes Father   . Hepatitis C Father   . Sickle cell anemia Father     Social History   Tobacco Use  . Smoking status: Former Smoker    Quit date: 11/18/2009    Years since quitting: 11.0  . Smokeless tobacco: Never Used   Vaping Use  . Vaping Use: Never used  Substance Use Topics  . Alcohol use: No  . Drug use: No    Home Medications Prior to Admission medications   Medication Sig Start Date End Date Taking? Authorizing Provider  Prenatal Vit-Fe Fumarate-FA (PRENATAL MULTIVITAMIN) TABS tablet Take 1 tablet by mouth at bedtime.    [provider]    Allergies    Patient has no known allergies.  Review of Systems   Review of Systems  Constitutional: Negative for chills and fever.  Respiratory: Negative for cough and shortness of breath.   Cardiovascular: Negative for chest pain and palpitations.  Gastrointestinal: Negative for abdominal pain and vomiting.  Musculoskeletal: Positive for arthralgias and myalgias.  Skin: Negative for color change and rash.  Neurological: Negative for weakness and numbness.  All other systems reviewed and are negative.   Physical Exam Updated Vital Signs BP 114/68 (BP Location: Right Arm)   Pulse 61   Temp 98.2 F (36.8 C) (  Oral)   Resp 16   LMP 11/22/2020   SpO2 99%   Physical Exam Constitutional:      General: She is not in acute distress. HENT:     Head: Normocephalic and atraumatic.  Eyes:     Conjunctiva/sclera: Conjunctivae normal.     Pupils: Pupils are equal, round, and reactive to light.  Cardiovascular:     Rate and Rhythm: Normal rate and regular rhythm.  Pulmonary:     Effort: Pulmonary effort is normal. No respiratory distress.  Musculoskeletal:        General: Normal range of motion.     Comments: TTP at base of 4th and 5th metatarsal, no visible deformity RUE: Full ROM at shoulder and elbow. No pain with flexion / extension / abduction / adduction at shoulder. No pain with flexion / extension / pronation / supination at elbow. Right wrist non-tender. No snuffbox tenderness. No pain with axial loading of R thumb. No superficial tenderness (skin) of R palm. Intact opposition, finger abduction, and wrist flexion /  extension.   Skin:    General: Skin is warm and dry.  Neurological:     General: No focal deficit present.     Mental Status: She is alert. Mental status is at baseline.  Psychiatric:        Mood and Affect: Mood normal.        Behavior: Behavior normal.     ED Results / Procedures / Treatments   Labs (all labs ordered are listed, but only abnormal results are displayed) Labs Reviewed - No data to display  EKG None  Radiology DG Hand Complete Right  Result Date: 11/28/2020 CLINICAL DATA:  Pain after trauma. Pain to the dorsal aspect of the hand at approximately the third through fifth metacarpals. EXAM: RIGHT HAND - COMPLETE 3+ VIEW COMPARISON:  None. FINDINGS: There is no evidence of fracture or dislocation. There is no evidence of arthropathy or other focal bone abnormality. Soft tissues are unremarkable. IMPRESSION: Negative. Electronically Signed   By: Dorise Bullion III M.D   On: 11/28/2020 15:24    Procedures Procedures   Medications Ordered in ED Medications - No data to display  ED Course  I have reviewed the triage vital signs and the nursing notes.  Pertinent labs & imaging results that were available during my care of the patient were reviewed by me and considered in my medical decision making (see chart for details).  Neurovascularly intact Xray reviewed - no acute fx No sign of infection on exam  Okay for discharge, NSAIDS at home  Clinical Course as of 11/28/20 1658  Sun Nov 28, 2020  1531 No acute fracture.  Okay for discharge. [MT]    Clinical Course User Index [MT] Laurisa Sahakian, Carola Rhine, MD     Final Clinical Impression(s) / ED Diagnoses Final diagnoses:  Pain of right hand    Rx / DC Orders ED Discharge Orders    None       Wyvonnia Dusky, MD 11/28/20 [phone]

## 2020-11-28 NOTE — Discharge Instructions (Signed)
You can take motrin (ibuprofen) 600 mg every 6 hours as needed for pain - for the next 5-7 days.  You can also take tylenol 650 mg every 6 hours for pain.

## 2020-12-16 IMAGING — US US OB < 14 WEEKS - US OB TV
1 series · 15 of 28 positions shown · non-contrast
Comparison: None.

CLINICAL DATA: Vaginal bleeding

EXAM:
OBSTETRIC <14 WK US AND TRANSVAGINAL OB US
TECHNIQUE: Both transabdominal and transvaginal ultrasound examinations were
performed for complete evaluation of the gestation as well as the
maternal uterus, adnexal regions, and pelvic cul-de-sac.
Transvaginal technique was performed to assess early pregnancy.

[Series 1: us ob < 14 weeks - us ob tv · 42 acquisitions, 15 frames shown]
[im 1/42]
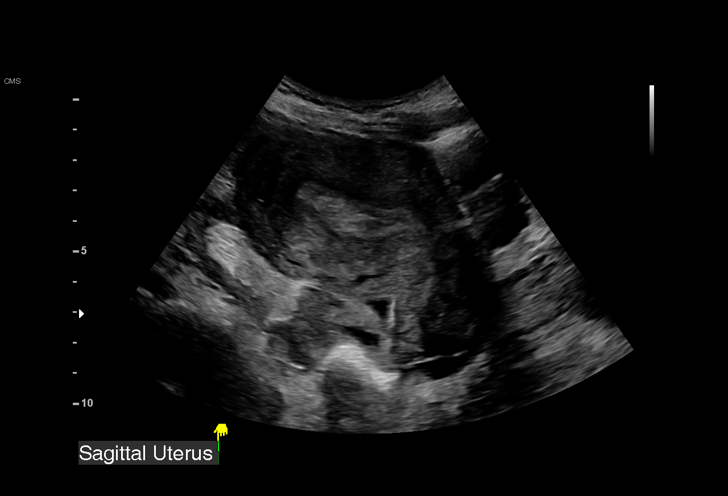
[im 4/42]
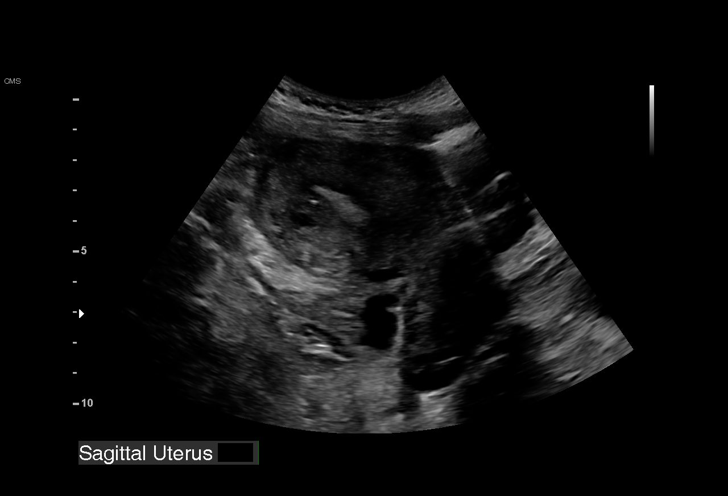
[im 7/42]
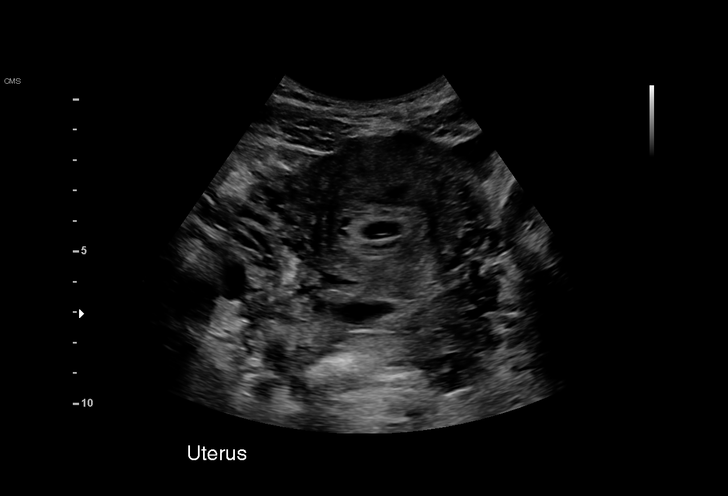
[im 10/42]
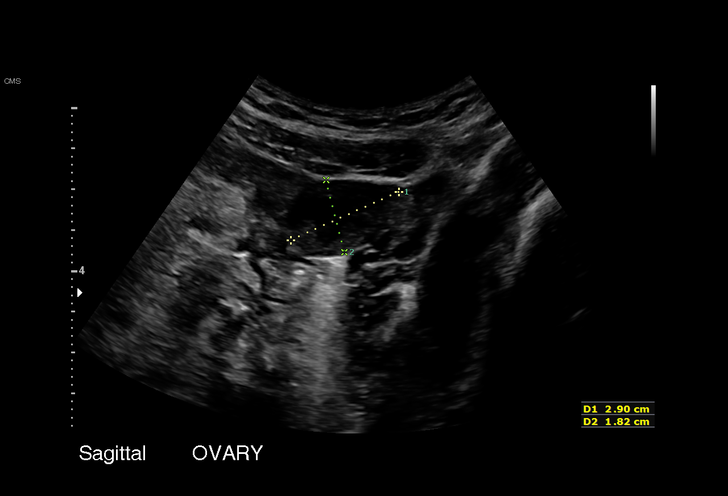
[im 13/42]
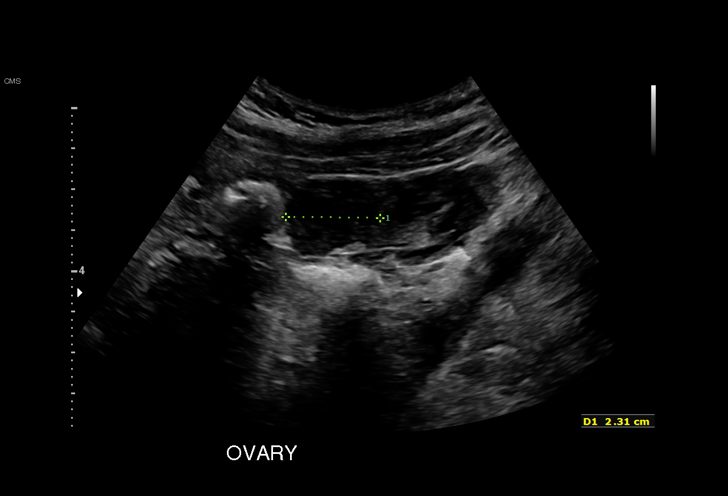
[im 16/42]
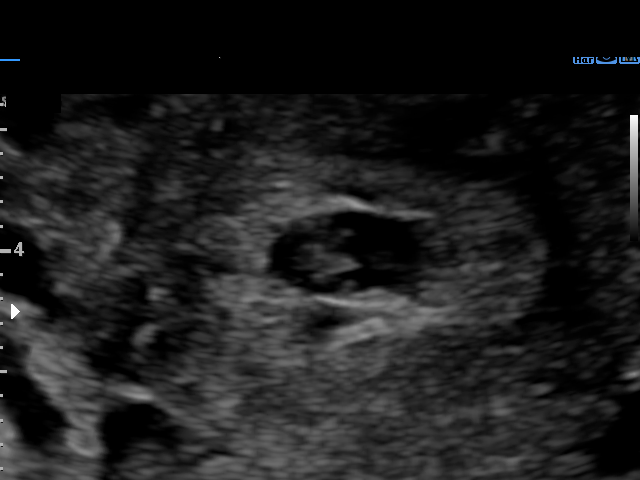
[im 19/42]
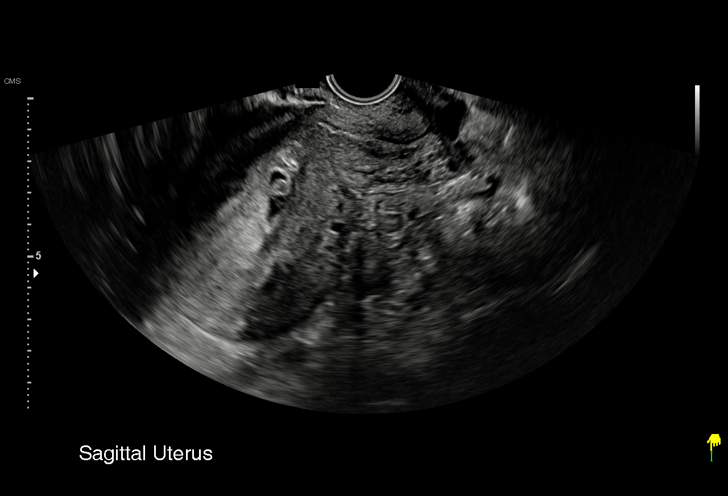
[im 22/42]
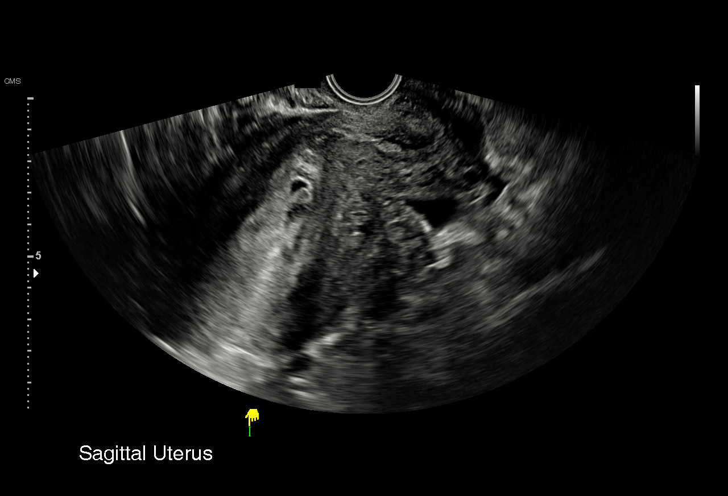
[im 23/42]
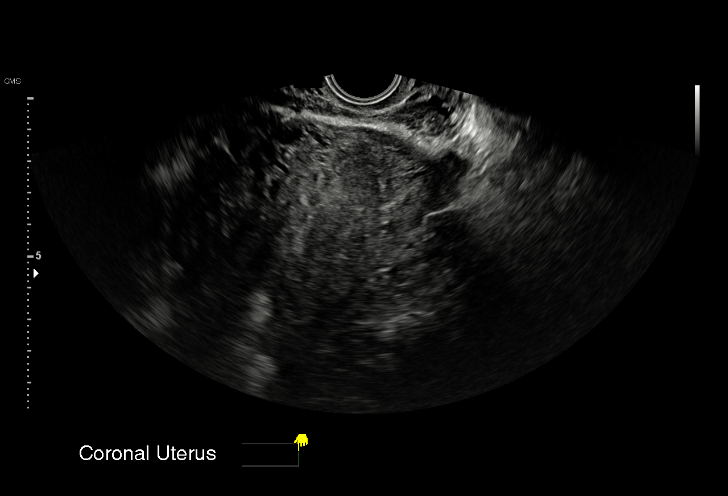
[im 26/42]
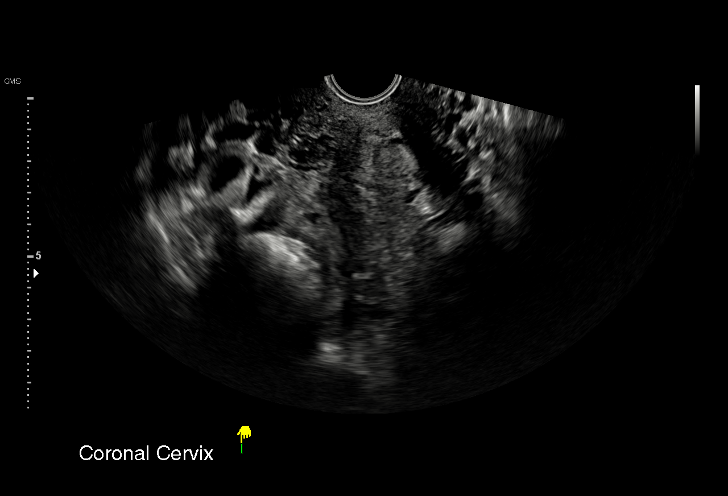
[im 29/42]
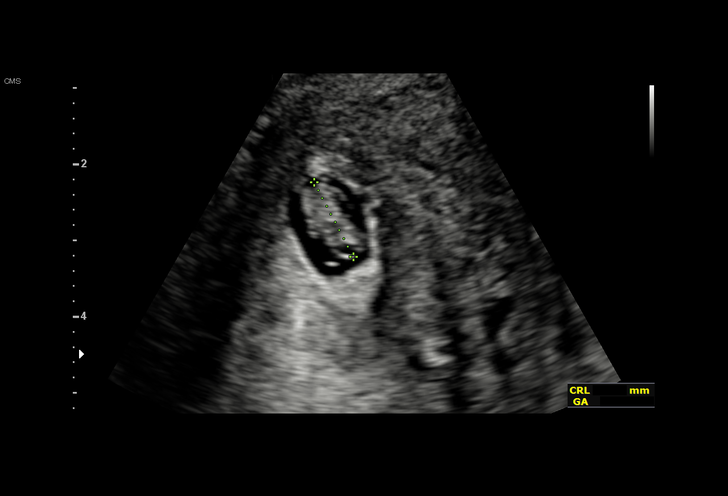
[im 32/42]
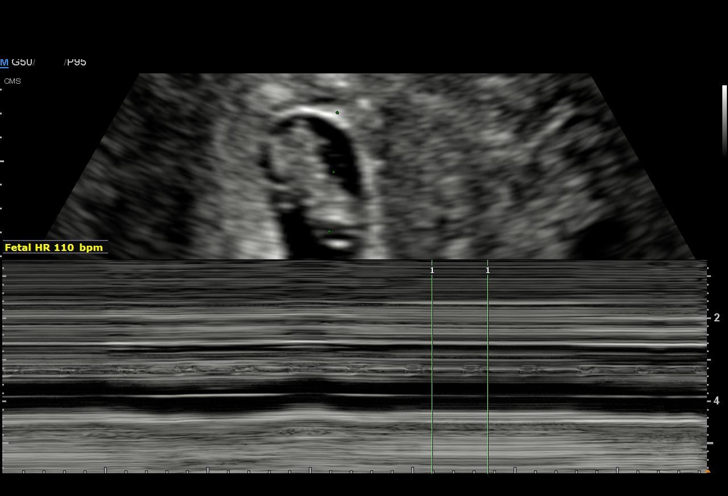
[im 35/42]
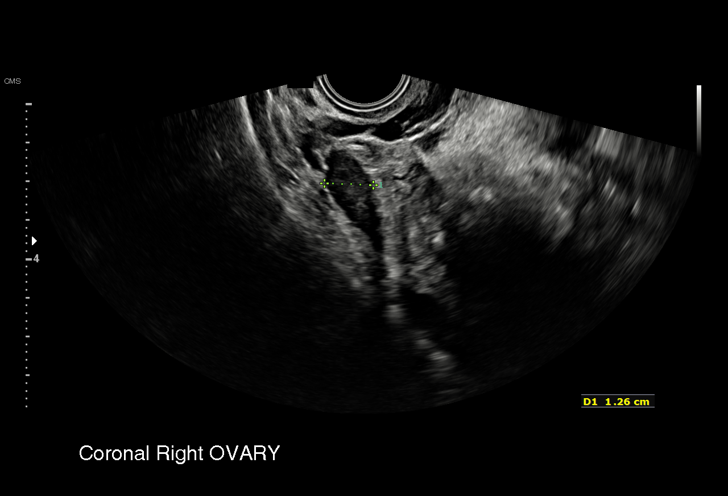
[im 38/42]
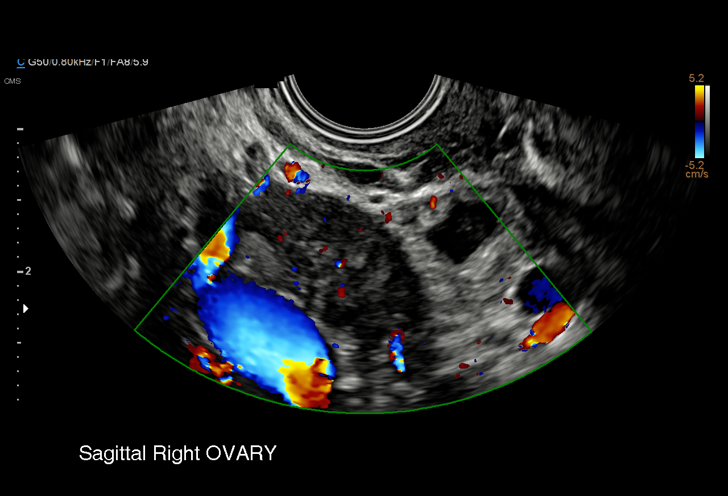
[im 42/42]
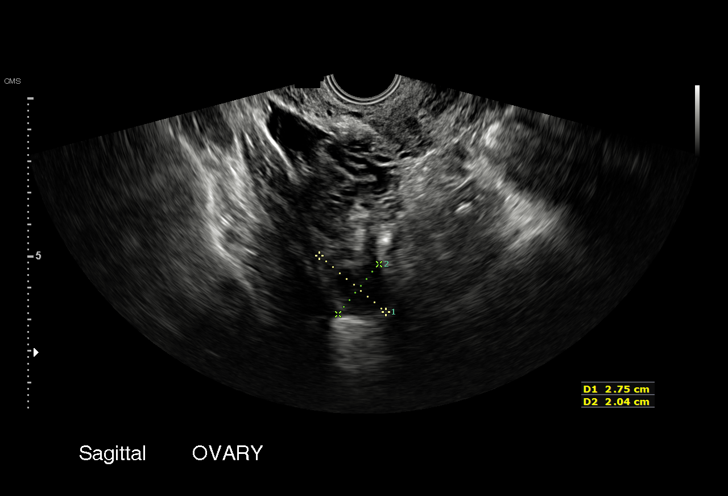

[15 of 28 positions shown; findings below may reference images not displayed]

FINDINGS: Intrauterine gestational sac: Visualized

Yolk sac:  Visualized

Embryo:  Visualized

Cardiac Activity: Visualized

Heart Rate: 110 bpm

CRL:  11 mm   7 w   1 d                  US EDC: April 14, 2020

Subchorionic hemorrhage:  None visualized.

Maternal uterus/adnexae: Cervical os is closed. No extrauterine
pelvic or adnexal masses are evident. No free pelvic fluid.
IMPRESSION: Single live intrauterine gestation with estimated gestational age of
approximately 7 weeks. No subchorionic hemorrhage. Study otherwise
unremarkable.

## 2021-02-16 ENCOUNTER — Other Ambulatory Visit: Payer: Self-pay

## 2021-02-16 ENCOUNTER — Emergency Department (HOSPITAL_BASED_OUTPATIENT_CLINIC_OR_DEPARTMENT_OTHER)
Admission: EM | Admit: 2021-02-16 | Discharge: 2021-02-16 | Disposition: A | Discharge status: Home / Self Care | Payer: Medicaid Other | Attending: Emergency Medicine | Admitting: Emergency Medicine

## 2021-02-16 ENCOUNTER — Encounter (HOSPITAL_BASED_OUTPATIENT_CLINIC_OR_DEPARTMENT_OTHER): Payer: Self-pay

## 2021-02-16 DIAGNOSIS — R21 Rash and other nonspecific skin eruption: Secondary | ICD-10-CM | POA: Diagnosis not present

## 2021-02-16 MED ORDER — CALAMINE EX LOTN: 1.0000 "application " | TOPICAL_LOTION | CUTANEOUS | 0 refills | Status: AC | PRN

## 2021-02-16 MED ORDER — HYDROXYZINE HCL 25 MG PO TABS: 25.0000 mg | ORAL_TABLET | Freq: Four times a day (QID) | ORAL | 0 refills | Status: AC

## 2021-02-16 MED ORDER — HYDROCORTISONE 1 % EX CREA: TOPICAL_CREAM | CUTANEOUS | 0 refills | Status: DC

## 2021-02-16 MED ORDER — CALAMINE EX LOTN: 1.0000 "application " | TOPICAL_LOTION | CUTANEOUS | 0 refills | Status: DC | PRN

## 2021-02-16 MED ORDER — HYDROCORTISONE 1 % EX CREA: TOPICAL_CREAM | CUTANEOUS | 0 refills | Status: AC

## 2021-02-16 MED ORDER — HYDROXYZINE HCL 25 MG PO TABS: 25.0000 mg | ORAL_TABLET | Freq: Four times a day (QID) | ORAL | 0 refills | Status: DC

## 2021-02-16 NOTE — ED Triage Notes (Signed)
Pt c/o scattered bug bites x 3 days-NAD-steady gait

## 2021-02-16 NOTE — Discharge Instructions (Addendum)
I written you forThe rash likely resembles bedbug bites.  Few medications to help with your symptoms.  You need to wash all of your belongings in hot water.  Atarax for itching  May use, and lotion and hydrocortisone cream to the rash.  Do not use hydrocortisone cream on face as this may cause bleaching  Follow up with primary care provider if symptoms worsening or do not improve

## 2021-02-16 NOTE — ED Provider Notes (Signed)
[facility] Provider Note   CSN: [id number] Arrival date & time: 02/16/21  1153     History Chief Complaint  Patient presents with   Insect Bite    Amanda [name] is a 35 y.o. female with past medical history who presents for evaluation of insect bites.  Began on "Sunday, 4 days PTA.  She was staying at her mother's who has had bedbugs previously.  States she looked under the mattress and they saw something moving.  Since then she has noted multiple pruritic bites all over her body.  These are nontender.  No recent changes in lotions, perfumes.  Her children have similar areas.  No fever, chills, nausea or vomiting.  No redness or warmth surrounding lesions.  Up-to-date on vaccines.  Denies additional aggravating or alleviating factors. N  History obtained from patient and past medical records.  No interpretor was used.   Of note patient here with family with similar rash  HPI     Past Medical History:  Diagnosis Date   Anemia    1st pregn   Ectopic pregnancy    MTX   Gestational diabetes    Gestational DM, diet controlled - last pregn   Hypoglycemia    Sickle cell trait (HCC)     Patient Active Problem List   Diagnosis Date Noted   Sciatic leg pain 05/29/2018   History of cesarean delivery 12/19/2017   History of gestational diabetes mellitus (GDM) 03/06/2016   Anxiety state 11/18/2015   Depression 11/18/2015   Sickle Cell Trait  Hemoglobin A-S genotype (HCC) 09/26/2015   HSV (herpes simplex virus) anogenital infection 07/12/2014    Past Surgical History:  Procedure Laterality Date   CESAREAN SECTION  07/09/2004   FTP - chorio   DILATION AND CURETTAGE OF UTERUS     2010   HERNIA REPAIR     20" 06    UMBILICAL HERNIA REPAIR       OB History     Gravida  7   Para  3   Term  3   Preterm      AB  3   Living  3      SAB  1   IAB  1   Ectopic  1   Multiple  0   Live Births  3           Family History   Problem Relation Age of Onset   Hyperlipidemia Mother    Diabetes Father    Hepatitis C Father    Sickle cell anemia Father     Social History   Tobacco Use   Smoking status: Never   Smokeless tobacco: Never  Vaping Use   Vaping Use: Never used  Substance Use Topics   Alcohol use: No   Drug use: No    Home Medications Prior to Admission medications   Medication Sig Start Date End Date Taking? Authorizing Provider  calamine lotion Apply 1 application topically as needed for itching. 02/16/21  Yes Arelys Glassco A, PA-C  hydrocortisone cream 1 % Apply to affected area 2 times daily 02/16/21  Yes Jenness Stemler A, PA-C  hydrOXYzine (ATARAX/VISTARIL) 25 MG tablet Take 1 tablet (25 mg total) by mouth every 6 (six) hours. 02/16/21  Yes Miria Cappelli A, PA-C  Prenatal Vit-Fe Fumarate-FA (PRENATAL MULTIVITAMIN) TABS tablet Take 1 tablet by mouth at bedtime.    [provider]    Allergies    Patient has no known allergies.  Review of Systems   Review of Systems  Constitutional: Negative.   HENT: Negative.    Respiratory: Negative.    Cardiovascular: Negative.   Gastrointestinal: Negative.   Genitourinary: Negative.   Musculoskeletal: Negative.   Skin:  Positive for rash.  Neurological: Negative.   All other systems reviewed and are negative.  Physical Exam Updated Vital Signs BP 122/78 (BP Location: Left Arm)   Pulse 90   Temp 99 F (37.2 C) (Oral)   Resp 18   Ht 5\' 6"  (1.676 m)   Wt 77.1 kg   LMP 02/10/2021   SpO2 99%   BMI 27.44 kg/m   Physical Exam Vitals and nursing note reviewed.  Constitutional:      General: She is not in acute distress.    Appearance: She is well-developed. She is not ill-appearing, toxic-appearing or diaphoretic.  HENT:     Head: Normocephalic and atraumatic.     Nose: Nose normal.     Mouth/Throat:     Mouth: Mucous membranes are moist.  Eyes:     Pupils: Pupils are equal, round, and reactive to light.   Cardiovascular:     Rate and Rhythm: Normal rate.     Pulses: Normal pulses.     Heart sounds: Normal heart sounds.  Pulmonary:     Effort: Pulmonary effort is normal. No respiratory distress.     Breath sounds: Normal breath sounds.  Abdominal:     General: Bowel sounds are normal. There is no distension.  Musculoskeletal:        General: Normal range of motion.     Cervical back: Normal range of motion.     Comments: No bony tenderness  Skin:    General: Skin is warm and dry.     Comments: See picture in chart.  Multiple lesions to bilateral upper and lower extremities as well as torso, neck.  No target lesions, vesicles, bulla, desquamated skin.  No intraoral lesions  Neurological:     General: No focal deficit present.     Mental Status: She is alert.  Psychiatric:        Mood and Affect: Mood normal.     ED Results / Procedures / Treatments   Labs (all labs ordered are listed, but only abnormal results are displayed) Labs Reviewed - No data to display  EKG None  Radiology No results found.  Procedures Procedures   Medications Ordered in ED Medications - No data to display  ED Course  I have reviewed the triage vital signs and the nursing notes.  Pertinent labs & imaging results that were available during my care of the patient were reviewed by me and considered in my medical decision making (see chart for details).  Rash consistent with insect bite likely bed bug bites  Patient denies any difficulty breathing or swallowing.  Pt has a patent airway without stridor and is handling secretions without difficulty; no angioedema. No blisters, no pustules, no warmth, no draining sinus tracts, no superficial abscesses, no bullous impetigo, no vesicles, no desquamation, no target lesions with dusky purpura or a central bulla. Not tender to touch. No concern for superimposed infection. No concern for SJS, TEN, TSS, tick borne illness, syphilis or other life-threatening  condition. Will recommends symptomatic management  The patient has been appropriately medically screened and/or stabilized in the ED. I have low suspicion for any other emergent medical condition which would require further screening, evaluation or treatment in the ED or require inpatient management.  Patient is hemodynamically stable and in no acute distress.  Patient able to ambulate in department prior to ED.  Evaluation does not show acute pathology that would require ongoing or additional emergent interventions while in the emergency department or further inpatient treatment.  I have discussed the diagnosis with the patient and answered all questions.  Pain is been managed while in the emergency department and patient has no further complaints prior to discharge.  Patient is comfortable with plan discussed in room and is stable for discharge at this time.  I have discussed strict return precautions for returning to the emergency department.  Patient was encouraged to follow-up with PCP/specialist refer to at discharge.    MDM Rules/Calculators/A&P                           Final Clinical Impression(s) / ED Diagnoses Final diagnoses:  Rash    Rx / DC Orders ED Discharge Orders          Ordered    hydrOXYzine (ATARAX/VISTARIL) 25 MG tablet  Every 6 hours        02/16/21 1305    calamine lotion  As needed        02/16/21 1305    hydrocortisone cream 1 %        02/16/21 1305             Rogue Pautler A, PA-C 02/16/21 1306    Hayden Rasmussen, MD 02/16/21 [phone]
